# Patient Record
Sex: Female | Born: 1965
Health system: Southern US, Community
[De-identification: ages and names within clinical notes are randomized; demographics above are authoritative.]

## PROBLEM LIST (undated history)

## (undated) DIAGNOSIS — G9050 Complex regional pain syndrome I, unspecified: Secondary | ICD-10-CM

## (undated) DIAGNOSIS — K5010 Crohn's disease of large intestine without complications: Secondary | ICD-10-CM

## (undated) HISTORY — PX: ABDOMINAL HYSTERECTOMY: SHX81

## (undated) HISTORY — PX: COLON SURGERY: SHX602

---

## 2005-05-06 ENCOUNTER — Emergency Department (HOSPITAL_COMMUNITY): Admission: EM | Admit: 2005-05-06 | Discharge: 2005-05-06 | Payer: Self-pay | Admitting: Emergency Medicine

## 2006-02-08 ENCOUNTER — Emergency Department (HOSPITAL_COMMUNITY): Admission: EM | Admit: 2006-02-08 | Discharge: 2006-02-08 | Payer: Self-pay | Admitting: Emergency Medicine

## 2006-02-27 ENCOUNTER — Inpatient Hospital Stay (HOSPITAL_COMMUNITY): Admission: EM | Admit: 2006-02-27 | Discharge: 2006-03-06 | Payer: Self-pay | Admitting: Emergency Medicine

## 2007-04-26 ENCOUNTER — Emergency Department (HOSPITAL_COMMUNITY): Admission: EM | Admit: 2007-04-26 | Discharge: 2007-04-26 | Payer: Self-pay | Admitting: *Deleted

## 2007-05-06 ENCOUNTER — Ambulatory Visit: Payer: Self-pay | Admitting: Psychiatry

## 2007-05-06 ENCOUNTER — Other Ambulatory Visit: Payer: Self-pay | Admitting: Emergency Medicine

## 2007-05-06 ENCOUNTER — Inpatient Hospital Stay (HOSPITAL_COMMUNITY): Admission: RE | Admit: 2007-05-06 | Discharge: 2007-05-17 | Payer: Self-pay | Admitting: Psychiatry

## 2007-05-15 ENCOUNTER — Encounter (HOSPITAL_COMMUNITY): Payer: Self-pay | Admitting: Psychiatry

## 2007-06-07 ENCOUNTER — Ambulatory Visit: Payer: Self-pay | Admitting: Psychiatry

## 2007-06-07 ENCOUNTER — Inpatient Hospital Stay (HOSPITAL_COMMUNITY): Admission: RE | Admit: 2007-06-07 | Discharge: 2007-06-12 | Payer: Self-pay | Admitting: Psychiatry

## 2008-11-26 ENCOUNTER — Emergency Department (HOSPITAL_COMMUNITY): Admission: EM | Admit: 2008-11-26 | Discharge: 2008-11-26 | Payer: Self-pay | Admitting: Emergency Medicine

## 2008-12-06 ENCOUNTER — Inpatient Hospital Stay (HOSPITAL_COMMUNITY): Admission: EM | Admit: 2008-12-06 | Discharge: 2008-12-10 | Payer: Self-pay | Admitting: Emergency Medicine

## 2010-02-03 ENCOUNTER — Inpatient Hospital Stay: Payer: Self-pay | Admitting: Psychiatry

## 2010-03-08 ENCOUNTER — Emergency Department: Payer: Self-pay | Admitting: Emergency Medicine

## 2010-03-29 ENCOUNTER — Emergency Department: Payer: Self-pay | Admitting: Emergency Medicine

## 2010-06-04 ENCOUNTER — Encounter: Payer: Self-pay | Admitting: Gastroenterology

## 2010-08-21 LAB — URINE MICROSCOPIC-ADD ON

## 2010-08-21 LAB — DIFFERENTIAL
Basophils Absolute: 0 10*3/uL (ref 0.0–0.1)
Basophils Absolute: 0 10*3/uL (ref 0.0–0.1)
Basophils Absolute: 0.1 10*3/uL (ref 0.0–0.1)
Basophils Relative: 0 % (ref 0–1)
Basophils Relative: 1 % (ref 0–1)
Eosinophils Absolute: 0 10*3/uL (ref 0.0–0.7)
Eosinophils Relative: 0 % (ref 0–5)
Eosinophils Relative: 0 % (ref 0–5)
Lymphocytes Relative: 10 % — ABNORMAL LOW (ref 12–46)
Lymphocytes Relative: 28 % (ref 12–46)
Lymphocytes Relative: 40 % (ref 12–46)
Lymphs Abs: 4.2 10*3/uL — ABNORMAL HIGH (ref 0.7–4.0)
Monocytes Absolute: 0 10*3/uL — ABNORMAL LOW (ref 0.1–1.0)
Monocytes Absolute: 0.4 10*3/uL (ref 0.1–1.0)
Neutro Abs: 4.5 10*3/uL (ref 1.7–7.7)
Neutro Abs: 5.4 10*3/uL (ref 1.7–7.7)
Neutrophils Relative %: 52 % (ref 43–77)
Neutrophils Relative %: 65 % (ref 43–77)

## 2010-08-21 LAB — CBC
HCT: 37.5 % (ref 36.0–46.0)
HCT: 37.7 % (ref 36.0–46.0)
Hemoglobin: 13 g/dL (ref 12.0–15.0)
MCHC: 33.8 g/dL (ref 30.0–36.0)
MCHC: 34.4 g/dL (ref 30.0–36.0)
MCV: 92.1 fL (ref 78.0–100.0)
MCV: 92.4 fL (ref 78.0–100.0)
Platelets: 389 10*3/uL (ref 150–400)
Platelets: 399 10*3/uL (ref 150–400)
Platelets: 404 10*3/uL — ABNORMAL HIGH (ref 150–400)
RBC: 4.05 MIL/uL (ref 3.87–5.11)
RDW: 14.6 % (ref 11.5–15.5)
RDW: 15 % (ref 11.5–15.5)
WBC: 10.4 10*3/uL (ref 4.0–10.5)

## 2010-08-21 LAB — COMPREHENSIVE METABOLIC PANEL
Albumin: 4.4 g/dL (ref 3.5–5.2)
Alkaline Phosphatase: 85 U/L (ref 39–117)
BUN: 5 mg/dL — ABNORMAL LOW (ref 6–23)
Chloride: 106 mEq/L (ref 96–112)
Creatinine, Ser: 0.72 mg/dL (ref 0.4–1.2)
Glucose, Bld: 113 mg/dL — ABNORMAL HIGH (ref 70–99)
Total Bilirubin: 0.8 mg/dL (ref 0.3–1.2)

## 2010-08-21 LAB — URINALYSIS, ROUTINE W REFLEX MICROSCOPIC
Nitrite: NEGATIVE
Specific Gravity, Urine: 1.008 (ref 1.005–1.030)
Urobilinogen, UA: 1 mg/dL (ref 0.0–1.0)
pH: 6.5 (ref 5.0–8.0)

## 2010-08-21 LAB — BASIC METABOLIC PANEL
BUN: 8 mg/dL (ref 6–23)
CO2: 24 mEq/L (ref 19–32)
GFR calc non Af Amer: >60 mL/min (ref >60)
Glucose, Bld: 147 mg/dL — ABNORMAL HIGH (ref 70–99)
Potassium: 3.7 mEq/L (ref 3.5–5.1)

## 2010-08-21 LAB — CK: Total CK: 105 U/L (ref 7–177)

## 2010-09-27 NOTE — Discharge Summary (Signed)
Julia Vega, Julia Vega              ACCOUNT NO.:  [id number]   MEDICAL RECORD NO.:  [id number]          PATIENT TYPE:  IPS   LOCATION:  0503                          FACILITY:  [facility]   PHYSICIAN:  Anselm Jungling, MD  DATE OF BIRTH:  Sep 15, 1965   DATE OF ADMISSION:  06/07/2007  DATE OF DISCHARGE:  06/12/2007                               DISCHARGE SUMMARY   IDENTIFYING DATA AND REASON FOR ADMISSION:  This was an inpatient  psychiatric admission for Julia Vega, a 45 year old separated white female  who presented expressing suicidal and homicidal thoughts, and auditory  and visual hallucinations.  Please refer to the admission note for  further details pertaining to the symptoms, circumstances and history  that led to her hospitalization.  She was given an initial Axis I  diagnosis of bipolar affective disorder, currently depressed with  psychotic features, and history of PTSD.   MEDICAL AND LABORATORY:  The patient was medically and physically  assessed by the psychiatric nurse practitioner.  There were no  significant medical issues during this brief inpatient psychiatric stay.   HOSPITAL COURSE:  The patient was admitted to the adult inpatient  psychiatric service.  She presented as a well-nourished, well-developed  woman with depressed mood and a labile affect.  Thought process is clear  and rational.  Judgment and insight were felt to be fair.  She endorsed  feeling suicidal with a plan to slit her wrist.  She also endorsed  auditory and visual hallucinations of a command nature.   The patient was treated with a psychotropic regimen that included  Risperdal, Neurontin, Cymbalta, Elavil, trazodone, and Klonopin.  She  was continued on Reglan for GERD symptoms.  She was involved in the  therapeutic milieu including therapeutic groups and activities.   She was initially evaluated by Dr. Milford Cage.  The undersigned  assumed care on the fourth hospital day.  On that date, the  patient  indicated that she was feeling better, although still having some  auditory hallucinations.  She stated that they were less constant than  they had been before.  She made no delusional statements.  She described  broken sleep, with frequent wakening.  Her appetite was adequate.  Subsequently, Risperdal was increased further.  On the following day the  patient indicated that I feel better, no suicidal thoughts, voices have  subsided, I feel happier.  She attributed this to positive interactions  with peers, staff, and our therapy groups.  She denied any thoughts of  hurting others or herself.  She reported that she was sleeping well.  She still appeared somewhat flat and depressed, but was making no  delusional statements, and appeared appropriate for discharge.   AFTERCARE:  The patient agreed to return to [facility] mental  health for psychiatric treatment on June 12, 2006.  She was also to  follow-up with Dr. Lolly Mustache in our [place] clinic with an appointment  on July 23, 2007.   DISCHARGE MEDICATIONS:  Risperdal 4 mg q.h.s., Neurontin 400 mg t.i.d.,  Cymbalta 60 mg daily, Elavil 50 mg q.h.s., trazodone 50-100 mg h.s.  p.r.n. insomnia, Reglan 5 mg, as directed, Klonopin 1 mg t.i.d. p.r.n.  anxiety.  The patient was also instructed to contact [facility] for individual therapy.  The patient was instructed to stop  taking Seroquel, Rozerem, and Effexor.   DISCHARGE DIAGNOSES:  AXIS I: Bipolar affective disorder, depressed,  with psychotic features, resolving.  AXIS II: Deferred.  AXIS III: History of GERD.  AXIS IV: Stressors severe.  AXIS V: GAF on discharge 55.      Anselm Jungling, MD  Electronically Signed     SPB/MEDQ  D:  06/19/2007  T:  06/19/2007  Job:  [id number]
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
## 2010-09-27 NOTE — Discharge Summary (Signed)
Julia Vega, Julia Vega              ACCOUNT NO.:  [id number]   MEDICAL RECORD NO.:  [id number]          PATIENT TYPE:  INP   LOCATION:  3035                         FACILITY:  [facility]   PHYSICIAN:  Ruthy Dick, MD    DATE OF BIRTH:  1966/04/17   DATE OF ADMISSION:  12/06/2008  DATE OF DISCHARGE:  12/10/2008                               DISCHARGE SUMMARY   REASON FOR ADMISSION:  Intractable low back pain and leg weakness.   FINAL DISCHARGE DIAGNOSES:  1. Intractable low back pain.  2. Reflex sympathetic dystrophy.  3. Narcotic dependence.  4. Fibromyalgia.  5. Depression.  6. Status post epidural steroid injection.  7. L4 nerve root compression.   BRIEF HISTORY OF PRESENT ILLNESS:  Julia Vega is a 45 year old  Caucasian lady with a past medical history significant for chronic pain  syndrome, reflex sympathetic dystrophy, and fibromyalgia who came in  with complaints of back pain which had been going on for 1 month.  She  said over the last 2 days prior to admission it had been gotten  uncontrollable and she decided to come to the emergency room.  Workup  included CT scan of the C-spine which showed herniated disk fragments in  the left lateral recess at L4-L5, extending superiorly behind the body  of L4 in the left lateral recess, compression to L4 nerve roots and  herniated disk fragment at L4-L5.  The patient had steroid injection at  L4-L5 foramens and the patient was seen by the neurosurgeon.  The plan  of care for this patient was that she was not a candidate for surgery at  this time and her pain will be managed with outpatient physical therapy  and mostly nonsteroidal anti-inflammatory medications.  At the time of  discharge, she says she had some pain, though not as bad as it was when  she came to the hospital, but the neurosurgeon was very clear that there  was no more procedure anticipated at this time.  They believe that the  patient's pain would gradually get  better within a few days.  Today, the  patient has no other complaints, no chest pain, no abdominal pain, no  nausea, no vomiting, no diarrhea, no constipation, no palpitations, no  dysuria, no frequency, no syncope.   PHYSICAL EXAMINATION:  VITAL SIGNS:  Today, temperature 98.0, pulse 72,  respirations 18, blood pressure 130/82, and saturating 95% on room air.  CHEST:  Clear to auscultation bilaterally.  ABDOMEN:  Soft and nontender.  EXTREMITIES:  No clubbing, no cyanosis, no edema.  CARDIOVASCULAR:  First and second heart sounds only.  CENTRAL NERVOUS SYSTEMS:  Nonfocal.   CONSULT DURING THIS ADMISSION:  Neurosurgical consult.   PROCEDURES DONE DURING THIS ADMISSION:  A CT scan of the L-spine which  showed a herniated disk fragment in the left lateral recess at L4-5  extending superiorly behind the body of the L4 in the left lateral  recess and compressing the left L4 nerve roots.   Time used for discharge planning is greater than 30 minutes.   The patient is to go home on  the following medications:  1. Fentanyl patch 25 mcg every 72 hours transdermally.  2. Percocet 7.5/325 every 6 hours as needed for pain.  3. Mobic 7.5 mg p.o. daily.  4. Robaxin 500 mg p.o. q.8 hours p.r.n. for pain.   The narcotics will have to be controlled by the Pain Clinic with the  patient visits.  We are not being to give any prescription for the  narcotics, but will write a prescription for Mobic and Robaxin.  I have  explained this to the patient and she says she has to deal with the Pain  Clinic that she should not receive prescription from any other  physician.  Home health with physical therapy recommended.      Ruthy Dick, MD  Electronically Signed     GU/MEDQ  D:  12/10/2008  T:  12/10/2008  Job:  [id number]   cc:   [facility]
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
## 2010-09-27 NOTE — H&P (Signed)
Julia Vega, Julia Vega              ACCOUNT NO.:  [id number]   MEDICAL RECORD NO.:  [id number]          PATIENT TYPE:  INP   LOCATION:  3035                         FACILITY:  [facility]   PHYSICIAN:  Della Goo, M.D. DATE OF BIRTH:  01-15-1966   DATE OF ADMISSION:  12/06/2008  DATE OF DISCHARGE:                              HISTORY & PHYSICAL   PRIMARY CARE PHYSICIAN:  [facility]   CHIEF COMPLAINT:  Back pain, weakness of both legs.   HISTORY OF PRESENT ILLNESS:  This is a 45 year old female who presents  to the emergency department with complaints of severe low back pain for  one month.  She reports over the past 2 days the pain has worsened and  has been uncontrollable.  She states the pain has started to radiate  into her left leg.  She also reports beginning to have pain in her right  leg as well.  The patient denies having any recent trauma.  The patient  does state that she removed her fentanyl patch one day ago and has not  replaced her patch.  She also states that she has not seen her primary  care physician in regard to her symptoms.  The patient had been [place]  [place] for her symptoms,  and she states they could not find any problems.  The patient also  reports having transient edema of both lower legs.   PAST MEDICAL HISTORY:  1. Significant for fibromyalgia.  2. History of reflex sympathetic dystrophy of the right arm after      surgery 7 years ago.   PAST SURGICAL HISTORY:  1. History of a hysterectomy.  2. Appendectomy.  3. Tonsillectomy.  4. Adenoidectomy.  5. History of a De Quervain's release surgery of the right arm.   MEDICATIONS:  1. Fentanyl patch 25 mcg per hour one patch q.72 h.  2. Percocet 7.5 mg/500 mg one p.o. q.6 h p.r.n. pain.  3. Xanax 0.5 mg p.o. b.i.d.   ALLERGIES:  NO KNOWN DRUG ALLERGIES.   SOCIAL HISTORY:  The patient is a smoker, smokes a half-a-pack of  cigarettes  daily.  She denies drinking and denies any illicit drug  usage.   FAMILY HISTORY:  Noncontributory.   REVIEW OF SYSTEMS:  Pertinents as mentioned above.   PHYSICAL EXAMINATION FINDINGS:  GENERAL:  This is a 45 year old anxious,  tearful, well-nourished, well-developed female in discomfort but no  acute distress.  VITAL SIGNS:  Are temperature 98.0, blood pressure 131/81, heart rate  89, respirations 18-20 and O2 saturation 98%.  HEENT:  Examination normocephalic, atraumatic.  Pupils equally round  reactive to light.  Extraocular movements are intact funduscopic benign.  There is no scleral icterus.  Nares are patent bilaterally.  Oropharynx  is clear.  NECK:  Supple full range of motion.  No thyromegaly, adenopathy, jugular  venous distention.  CARDIOVASCULAR:  Regular rate and rhythm.  No murmurs, gallops or rubs.  LUNGS:  Clear to auscultation bilaterally.  ABDOMEN:  Positive bowel sounds, soft, nontender, nondistended.  EXTREMITIES:  Without cyanosis, clubbing  or edema.  NEUROLOGIC:  The patient is alert and oriented x3.  Cranial nerves are  intact.  She is able to move all four of her extremities.  There are no  focal deficits on examination.   LABORATORY STUDIES:  White blood cell count 7.0, hemoglobin 12.7,  hematocrit 37.5, MCV 92.4, platelets 399, neutrophils 65%, lymphocytes  28%.  Sodium 139, potassium 3.7, chloride 106, carbon dioxide 22, BUN 5,  creatinine 0.72 and glucose 113.  Urinalysis with trace leukocyte  esterase and trace hemoglobin.  Urine microscopic with 0-2 white blood  cells.  CT scan of the lumbar spine without contrast performed reveals a  small herniated disk fragment in the left lateral recess behind the body  of L4 compressing the left L4 nerve root.   ASSESSMENT:  A 45 year old female being admitted with  1. Intractable low back pain with left lower extremity sciatica.  2. Herniated disk with left lateral recess compressing the left L4      nerve  root seen on CT.  3. Fibromyalgia.  4. RSD of the right upper extremity.  5. Chronic pain syndrome.   PLAN:  The patient will be admitted for 23 hour observation.  Pain  control therapy has been ordered to help get the patient's pain under  control.  The patient will also be placed on muscle relaxant therapy as  needed along with a steroid taper.  Referral to neurosurgery for  evaluation of the patient's low back finding on CT scan will be  considered whether to be done as an inpatient versus outpatient.  The  patient will be placed on DVT and GI prophylaxis as well.      Della Goo, M.D.  Electronically Signed     HJ/MEDQ  D:  12/07/2008  T:  12/07/2008  Job:  [id number]
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
## 2010-09-30 NOTE — Consult Note (Signed)
Julia Vega, Julia Vega              ACCOUNT NO.:  [id number]   MEDICAL RECORD NO.:  [id number]           PATIENT TYPE:   LOCATION:                                 FACILITY:   PHYSICIAN:  Revonda Standard L. Rennis Harding, N.P. DATE OF BIRTH:  October 19, 1965   DATE OF CONSULTATION:  02/28/2006  DATE OF DISCHARGE:                                   CONSULTATION   CONSULTING SURGEON:  Adolph Pollack, M.D.   REQUESTING PHYSICIAN:  Dr. Randa Evens.   PRIMARY CARE PHYSICIAN:  Dr. Pernell Dupre [place]   REASON FOR CONSULTATION:  Right upper quadrant abdominal pain x2 months and  probable ileus.   HISTORY OF PRESENT ILLNESS:  Julia Vega is a 45 year old female patient.  Surgical history consistent with a prior total abdominal hysterectomy and  open appendectomy.  She does have RSV involving the right wrist, but does  not take any chronic of pain medications for this.  The patient reports 2  months worth of intermittent right upper quadrant pain, usually  postprandial, and usually aggravated by fatty/greasy-type foods. Because of  this, she has developed significant anorexia and avoids most type of foods.  When she does try to eat solid foods, she has noticed that she will have  immediate emesis, but she denies dysphasia or pain with swallowing or a  sensation of food getting stuck into the chest.   She reports over the past 2 months she has mostly been having watery  diarrhea with occasional small pieces of formed stool noted, no blood in the  stool, and occasional low grade temperatures.  Over the past 2-3 days her  pain has become more severe and near constant; and she has developed  significant abdominal distension.  The distension seems to have been  worsening progressively over the past 2 weeks the pain was now radiating  from the right upper quadrant through to the back.  She has had several  workups done as an outpatient apparently in [place].  Within the past  month she has had  ultrasound and CT which were unrevealing.  She was  admitted on 02/27/2006 where a CT scan, ultrasound, and x-ray were read by  radiologist with no acute process.  Her LFTs have been normal.  Her white  count is normal; and an amylase was normal.  On the 16th she was in the ER  in September; and a lipase was normal then.  Because of the patient's  confounding history and  significant pain and abdominal distension a  surgical consultation has been requested.   REVIEW OF SYSTEMS:  As above.  Again, the patient denies dysphagia.  She is  reporting immediate emesis after eating.  She develops pain, the nausea,  then the emesis.  She reports that she has not had an EGD since the onset of  the symptoms.  She had a colonoscopy about 1 month ago which was  unrevealing.  She reports occasional dark urine that is nonodorous.   PAST MEDICAL HISTORY:  1. RSV of the right wrist after ganglion cyst was removed.  2. Dyslipidemia.  PAST SURGICAL HISTORY:  1. Total abdominal hysterectomy due to prior ovarian cyst.  2. Appendectomy, open.  3. Diagnostic laparoscopy.   SURGICAL HISTORY:  She does smoke.  She denies alcoholic beverages.  She is  disabled secondary to her RSV.   FAMILY HISTORY:  Significant for gastric cancer and ovarian cancer.   ALLERGIES:  MORPHINE and VICODIN cause her eyes to swell.   HOME MEDICATIONS INCLUDE:  1. Phenergan  2. Since admission the patient has been started on IV Dilaudid, Zofran and      IV fluids.   PHYSICAL EXAMINATION:  GENERAL: This is a an acutely ill-appearing female  patient complaining of severe right upper quadrant abdominal pain with  marked distension of the abdomen.  VITAL SIGNS:  Temperature 98.4, BP 88/54, pulse 64, respirations 20.  NEURO:  The patient is awake and oriented x3 moving all extremities x4  without focal neurological deficits.  HEENT:  Head is normocephalic.  Sclerae noninjected, nonicteric.  NECK:  Supple.  No adenopathy.   CHEST:  Bilateral lung sounds are clear to auscultation.  Respiratory effort  is nonlabored.  CARDIAC:  S1-S2 regular pulse.  No murmurs, thrills, or gallops.  Systolic  blood pressure down to 88.  IV fluids are at 100 an hour.  ABDOMEN:  Markedly distended and tympanitic.  Diffusely tender with  diminished bowel sounds.  She is also more tender with guarding and  rebounding in the right upper quadrant.  There are no hernias noted in the  prior low Pfannenstiel incision sites.  EXTREMITIES:  Symmetrical in appearance without edema, cyanosis, or  clubbing.   LAB:  Amylase 74, lipase normal in September of this year.  Urinalysis  negative.  White count 4800, hemoglobin 11.7, platelets 322,000.  Sodium  139, potassium 3.7, glucose 94, BUN 7, creatinine 0.9, ALT of 17, AST 19,  total bilirubin 0.6.  Diagnostic CT of the abdomen and pelvis as noted in  the history present illness.  I have reviewed the CT scan; and upon my  review, she appears to have some mild gallbladder wall thickening, no  stones, no pericholecystic fluid.  She does have a significant amount of  colon stool; and there is contrast throughout the small bowel but does not  pass completely through the colon.  Actually at the distal small bowel,  there appears be some dilatation, and the colorectal area is decompressed.  On the plain abdominal films there is a nonspecific bowel gas pattern.  I  did not review the ultrasound   IMPRESSION:  1. Right upper quadrant abdominal pain, possible chronic cholecystitis,      etiology uncertain based on diagnostic workup.  2. Abdominal distension secondary to probable fecal impaction as well as      ileus.  3. Positive family medical history of gastric cancer.  4. RSV of the right hand and wrist.   PLAN:  1. As noted the diagnostic evaluation is not revealing.  Given the     patient's clinical presentation, we will check a HIDA scan to rule out      biliary dyskinesia or  cholecystitis that is not showing up on CT or      ultrasound.  2. If the HIDA scan is negative the patient may need a small-bowel follow-      through to rule out gastric outlet obstruction motility disorder or      proximal small bowel issues.  3. No sure if chronic constipation is a contributing factor.  The patient      denies history of constipation issues or change in bowel patterns until      2 months ago when these initial symptoms started.  4. The patient has mild decrease in systolic blood pressure.  After pain      medications have been received, we will      increase her IV fluids to 150 an hour given that she has abdominal      distension and an ileus pattern.  5. Uncertain at this point if noninvasive diagnostic workup remains      unrevealing if diagnostic laparoscopy would be helpful.  I will defer      this to Dr. Abbey Chatters after he examines the patient and reviews the x-      ray studies.      Allison L. Rennis Harding, N.P.     ALE/MEDQ  D:  02/28/2006  T:  03/01/2006  Job:  [id number]   cc:   [place]  Deal Dr. Pernell Dupre

## 2010-09-30 NOTE — Discharge Summary (Signed)
Julia Vega, BUFFONE              ACCOUNT NO.:  [id number]   MEDICAL RECORD NO.:  [id number]          PATIENT TYPE:  OUT   LOCATION:  NUC                          FACILITY:  [facility]   PHYSICIAN:  James L. Randa Evens, M.D. DATE OF BIRTH:  [birth year]   DATE OF ADMISSION:  02/27/2006  DATE OF DISCHARGE:  03/06/2006                                 DISCHARGE SUMMARY   DIAGNOSIS:  Abdominal pain, distension, shortness of breath.   DIAGNOSES:  Is still abdominal pain and distension of a unclear etiology.   SERVICE:  Gastroenterology.   HISTORY:  This is a 45 year old female with a history of psychiatric  disorders including depression and PTSD.  She reports a 2-1/1-month history  of abdominal pain, distension, vomiting after she eats.  She describes the  pain as right upper quadrant that radiates to her back, wakes her up in the  middle of the night, is worse when eating fatty foods.  She had a recent  hospital stay at [facility] in September 2007 for similar symptoms.  She had a normal colonoscopy and showed gastritis on EGD at [facility].  During this hospital stay at [facility], she has any  diagnostic exams including ultrasound of her abdomen, a CT of her abdomen  and pelvis, HIDA scan.  We were unable to do ejection fraction on the HIDA  scan because they had given her morphine as well as a small bowel follow  through.  All of these diagnostics proved to be within normal limits and did  not give [place] an etiology of her abdominal pain and distention.  She had a  normal TSH.  She had normal comprehensive metabolic panel including normal  liver enzymes.  She had normal urine dip while she was here.  She did have a  consult with Dr. Abbey Chatters of [facility].  They evaluated her  for possible cholecystectomy, but he said he felt  that she had no surgical  issues  Did have a discussion with the patient that sometimes gallbladder  pain can be deceiving in  that it does not show up in laboratory diagnostics,  yet some people feel better after their gallbladder has been removed.  Our  plan for her was to discontinue the Dilaudid and repeat the HIDA scan, to  make an extra effort to her to gain the ejection fraction with CCK.  We  discontinued the Dilaudid last night.  Her pain increased.  When we went to  her room this morning to evaluate her, she told us that she had decided to  leave.  We explained that we would really like for her to stay so that we  could continue with the HIDA scan and the CCK today today.  If that was  negative, we were going to check gastric emptying study for possible  gastroparesis, but she had made up her mind to leave.  We did go ahead and  write discharge orders and gave her a prescription for Reglan.  She was  advised to follow up with Dr. Carman Ching at [facility] as  well as her primary care physician Dr. Pernell Dupre in [place].  It is worth  noting that before the patient could receive formal discharge from the nurse  and her discharge instruction, she pulled out her own IV and left.  The  patient was discharged in stable condition.   DISCHARGE MEDICATIONS:  1. Phenergan which she was on when she came in.  2. MiraLax.  3. Reglan 10 mg 30 minutes before meals and at bedtime.  She received a      prescription for 120 pills of Reglan.  A      psychiatric consult was also offered to the patient while she was here      and she refused it.  That that at that the patient please send a copy      of note to Dr. Fayrene Fearing that were well as Dr. Doreatha Martin again as well as Dr. ad      on the primary care physician point and that was drawn on x-ray she      Stephani Police, [place]    ______________________________  Llana Aliment. Randa Evens, M.D.    MLY/MEDQ  D:  03/06/2006  T:  03/07/2006  Job:  [id number]   cc:   Fayrene Fearing L. Malon Kindle., M.D.  Graylin Shiver, M.D.  [place], [place] Bonney Leitz, M.D.

## 2010-09-30 NOTE — H&P (Signed)
Julia Vega, Julia Vega              ACCOUNT NO.:  [id number]   MEDICAL RECORD NO.:  [id number]          PATIENT TYPE:  INP   LOCATION:  6737                         FACILITY:  [facility]   PHYSICIAN:  James L. Malon Kindle., M.D.DATE OF BIRTH:  May 14, 1966   DATE OF ADMISSION:  02/27/2006  DATE OF DISCHARGE:                                HISTORY & PHYSICAL   REASON FOR ADMISSION:  Progressive abdominal pain with abdominal distention  and shortness of breath.   HISTORY:  A 45 year old woman that I saw for the first time several days ago  for abdominal pain that she has had intermittently for 3 months.  This is  epigastric, right upper quadrant, goes around the back, is worse after  eating.  She was evaluated in [place] and the records that we obtained  from [facility] showed that she has had a CT scan of the abdomen, which was  normal.  She is reported to have an ultrasound of the abdomen and it is  normal, but we do not have a copy of that.  We saw her in the office for the  first time and have still not obtained the ultrasound of the abdomen.  She  did have a CT of the abdomen as well, with a renal stone protocol, and that  was negative.  She said, initially, the pain would come and go, but it has  gotten progressively constant.  We saw her for the first time 4 days ago,  she was quite tender in the right upper quadrant, stools were negative.  We  ordered a hepatobiliary scan, which has not yet been done.  Labs were  obtained.  Her sed rate, liver function, and CBC were all normal.  The  patient notes that, since yesterday, the pain has gotten progressively  worse.  During the night, she got short of breath, it hurts when she  breaths.  Her abdomen has become much more distended.  She had been having  diarrhea, now she is not really having much in the way of stools.  She still  has no fever.   CURRENT MEDICATIONS:  Phenergan.   ALLERGIES:  NO DRUG ALLERGIES.   PAST MEDICAL HISTORY:   History of migraines and depression.  No other  chronic medical problems.  She is status post tonsillectomy, appendectomy,  hysterectomy, and surgery on her arm.  She has RSD after her surgery.   FAMILY HISTORY:  Positive for colon polyps in mother, father, and brother.  Gallstones in her grandmother.   SOCIAL HISTORY:  She is disabled, has RSD, lives with her mother, smokes 1/2  pack per day and has for 5 years, does not drink.   REVIEW OF SYSTEMS:  Remarkable for the pain, recent diarrhea.   PHYSICAL EXAMINATION:  VITAL SIGNS:  Temperature 97, pulse 62, blood  pressure 110/70, weight 162.  GENERAL:  A somewhat chubby, white female who clearly appears to be in pain.  She is alert and oriented.  EYES:  Sclerae nonicteric.  NECK:  Supple  LUNGS:  Clear.  I do not hear any friction rubs on  the right.  HEART:  Without murmurs or gallops.  ABDOMEN:  Markedly distended and quiet.  I hear no bowel sounds.  There is  marked tenderness and some rigidity on the right side with guarding.  RECTAL:  Not repeated.  Stool was heme-negative just a couple of days ago.   ASSESSMENT:  Severe right-sided pain.  The patient, now, has shortness of  breath and pain going down her right leg with this.  There is concern that  she could have had a perforation, particularly in view of her quite and  distended abdomen.   PLAN:  Will admit her to the hospital and obtain an acute abdominal series  and make further recommendations depending on thoughts of this.  She may  need another ultrasound and hepatobiliary scan.           ______________________________  Llana Aliment. Malon Kindle., M.D.     Waldron Session  D:  02/27/2006  T:  02/28/2006  Job:  [id number]   cc:   Raina Mina, M.D.

## 2010-11-04 ENCOUNTER — Emergency Department (HOSPITAL_BASED_OUTPATIENT_CLINIC_OR_DEPARTMENT_OTHER)
Admission: EM | Admit: 2010-11-04 | Discharge: 2010-11-04 | Disposition: A | Discharge status: Home / Self Care | Payer: Medicare Other | Attending: Emergency Medicine | Admitting: Emergency Medicine

## 2010-11-04 DIAGNOSIS — G8929 Other chronic pain: Secondary | ICD-10-CM | POA: Insufficient documentation

## 2010-11-04 DIAGNOSIS — IMO0002 Reserved for concepts with insufficient information to code with codable children: Secondary | ICD-10-CM | POA: Insufficient documentation

## 2010-11-04 DIAGNOSIS — F17200 Nicotine dependence, unspecified, uncomplicated: Secondary | ICD-10-CM | POA: Insufficient documentation

## 2010-11-04 DIAGNOSIS — M79609 Pain in unspecified limb: Secondary | ICD-10-CM | POA: Insufficient documentation

## 2010-11-04 DIAGNOSIS — IMO0001 Reserved for inherently not codable concepts without codable children: Secondary | ICD-10-CM | POA: Insufficient documentation

## 2010-11-05 ENCOUNTER — Other Ambulatory Visit (HOSPITAL_BASED_OUTPATIENT_CLINIC_OR_DEPARTMENT_OTHER): Payer: Self-pay

## 2011-02-02 LAB — DRUGS OF ABUSE SCREEN W/O ALC, ROUTINE URINE
Amphetamine Screen, Ur: NEGATIVE
Benzodiazepines.: NEGATIVE
Cocaine Metabolites: NEGATIVE
Phencyclidine (PCP): NEGATIVE
Propoxyphene: NEGATIVE

## 2011-02-02 LAB — HEPATITIS PANEL, ACUTE
Hep A IgM: NEGATIVE
Hep B C IgM: NEGATIVE
Hepatitis B Surface Ag: NEGATIVE

## 2011-02-02 LAB — PREGNANCY, URINE: Preg Test, Ur: NEGATIVE

## 2011-02-02 LAB — THC (MARIJUANA), URINE, CONFIRMATION: Marijuana, Ur-Confirmation: 19 ng/mL

## 2011-02-17 LAB — CBC
Hemoglobin: 13.1
Hemoglobin: 11.2 — ABNORMAL LOW
MCHC: 33.9
MCV: 90.7
MCV: 88.7
RBC: 4.27
RBC: 3.64 — ABNORMAL LOW
RDW: 14.9
WBC: 6.5

## 2011-02-17 LAB — COMPREHENSIVE METABOLIC PANEL
ALT: 127 — ABNORMAL HIGH
AST: 54 — ABNORMAL HIGH
CO2: 30
Chloride: 103
Creatinine, Ser: 0.79
GFR calc Af Amer: >60
GFR calc non Af Amer: >60
Total Bilirubin: 0.4

## 2011-02-17 LAB — URINALYSIS, ROUTINE W REFLEX MICROSCOPIC
Bilirubin Urine: NEGATIVE
Ketones, ur: NEGATIVE
Nitrite: NEGATIVE
Protein, ur: NEGATIVE
Urobilinogen, UA: 0.2
pH: 7

## 2011-02-17 LAB — I-STAT 8, (EC8 V) (CONVERTED LAB)
Bicarbonate: 24.6 — ABNORMAL HIGH
Glucose, Bld: 119 — ABNORMAL HIGH
HCT: 44
TCO2: 26
pCO2, Ven: 38.4 — ABNORMAL LOW
pH, Ven: 7.414 — ABNORMAL HIGH

## 2011-02-17 LAB — URINE CULTURE: Colony Count: NO GROWTH

## 2011-02-17 LAB — RAPID URINE DRUG SCREEN, HOSP PERFORMED
Amphetamines: NOT DETECTED
Benzodiazepines: POSITIVE — AB
Cocaine: NOT DETECTED
Opiates: NOT DETECTED
Tetrahydrocannabinol: POSITIVE — AB

## 2011-02-17 LAB — DIFFERENTIAL
Basophils Absolute: 0.1
Basophils Relative: 1
Eosinophils Absolute: 0.1 — ABNORMAL LOW
Monocytes Absolute: 0.3
Monocytes Relative: 5

## 2011-08-29 DIAGNOSIS — Z933 Colostomy status: Secondary | ICD-10-CM | POA: Diagnosis not present

## 2011-08-29 DIAGNOSIS — Z79899 Other long term (current) drug therapy: Secondary | ICD-10-CM | POA: Diagnosis not present

## 2011-08-29 DIAGNOSIS — K5732 Diverticulitis of large intestine without perforation or abscess without bleeding: Secondary | ICD-10-CM | POA: Diagnosis not present

## 2011-08-29 DIAGNOSIS — R109 Unspecified abdominal pain: Secondary | ICD-10-CM | POA: Diagnosis not present

## 2011-08-29 DIAGNOSIS — R1031 Right lower quadrant pain: Secondary | ICD-10-CM | POA: Diagnosis not present

## 2011-08-29 DIAGNOSIS — R112 Nausea with vomiting, unspecified: Secondary | ICD-10-CM | POA: Diagnosis not present

## 2011-09-28 DIAGNOSIS — Z933 Colostomy status: Secondary | ICD-10-CM | POA: Diagnosis not present

## 2011-09-28 DIAGNOSIS — K5732 Diverticulitis of large intestine without perforation or abscess without bleeding: Secondary | ICD-10-CM | POA: Diagnosis not present

## 2011-09-28 DIAGNOSIS — R109 Unspecified abdominal pain: Secondary | ICD-10-CM | POA: Diagnosis not present

## 2011-09-28 DIAGNOSIS — K5730 Diverticulosis of large intestine without perforation or abscess without bleeding: Secondary | ICD-10-CM | POA: Diagnosis not present

## 2011-09-30 DIAGNOSIS — R112 Nausea with vomiting, unspecified: Secondary | ICD-10-CM | POA: Diagnosis not present

## 2011-09-30 DIAGNOSIS — R109 Unspecified abdominal pain: Secondary | ICD-10-CM | POA: Diagnosis not present

## 2011-09-30 DIAGNOSIS — R1011 Right upper quadrant pain: Secondary | ICD-10-CM | POA: Diagnosis not present

## 2011-09-30 DIAGNOSIS — M549 Dorsalgia, unspecified: Secondary | ICD-10-CM | POA: Diagnosis not present

## 2011-09-30 DIAGNOSIS — R1013 Epigastric pain: Secondary | ICD-10-CM | POA: Diagnosis not present

## 2011-09-30 DIAGNOSIS — K824 Cholesterolosis of gallbladder: Secondary | ICD-10-CM | POA: Diagnosis not present

## 2011-09-30 DIAGNOSIS — K7689 Other specified diseases of liver: Secondary | ICD-10-CM | POA: Diagnosis not present

## 2011-09-30 DIAGNOSIS — Z79899 Other long term (current) drug therapy: Secondary | ICD-10-CM | POA: Diagnosis not present

## 2011-09-30 DIAGNOSIS — K828 Other specified diseases of gallbladder: Secondary | ICD-10-CM | POA: Diagnosis not present

## 2011-10-10 DIAGNOSIS — K562 Volvulus: Secondary | ICD-10-CM | POA: Diagnosis not present

## 2011-10-10 DIAGNOSIS — K811 Chronic cholecystitis: Secondary | ICD-10-CM | POA: Diagnosis not present

## 2011-10-10 DIAGNOSIS — R109 Unspecified abdominal pain: Secondary | ICD-10-CM | POA: Diagnosis not present

## 2011-10-10 DIAGNOSIS — Z433 Encounter for attention to colostomy: Secondary | ICD-10-CM | POA: Diagnosis not present

## 2011-10-10 DIAGNOSIS — K810 Acute cholecystitis: Secondary | ICD-10-CM | POA: Diagnosis not present

## 2011-10-10 DIAGNOSIS — K824 Cholesterolosis of gallbladder: Secondary | ICD-10-CM | POA: Diagnosis not present

## 2011-10-10 DIAGNOSIS — Z933 Colostomy status: Secondary | ICD-10-CM | POA: Diagnosis not present

## 2011-10-10 DIAGNOSIS — K8010 Calculus of gallbladder with chronic cholecystitis without obstruction: Secondary | ICD-10-CM | POA: Diagnosis not present

## 2011-10-10 DIAGNOSIS — K6389 Other specified diseases of intestine: Secondary | ICD-10-CM | POA: Diagnosis not present

## 2011-10-10 DIAGNOSIS — N736 Female pelvic peritoneal adhesions (postinfective): Secondary | ICD-10-CM | POA: Diagnosis not present

## 2011-10-10 DIAGNOSIS — K5900 Constipation, unspecified: Secondary | ICD-10-CM | POA: Diagnosis not present

## 2011-10-10 DIAGNOSIS — K5732 Diverticulitis of large intestine without perforation or abscess without bleeding: Secondary | ICD-10-CM | POA: Diagnosis not present

## 2011-10-10 DIAGNOSIS — Z79899 Other long term (current) drug therapy: Secondary | ICD-10-CM | POA: Diagnosis not present

## 2011-10-10 DIAGNOSIS — K8020 Calculus of gallbladder without cholecystitis without obstruction: Secondary | ICD-10-CM | POA: Diagnosis not present

## 2011-10-10 DIAGNOSIS — K5730 Diverticulosis of large intestine without perforation or abscess without bleeding: Secondary | ICD-10-CM | POA: Diagnosis not present

## 2011-10-10 DIAGNOSIS — R112 Nausea with vomiting, unspecified: Secondary | ICD-10-CM | POA: Diagnosis not present

## 2011-10-21 DIAGNOSIS — Z452 Encounter for adjustment and management of vascular access device: Secondary | ICD-10-CM | POA: Diagnosis not present

## 2011-10-21 DIAGNOSIS — Z8719 Personal history of other diseases of the digestive system: Secondary | ICD-10-CM | POA: Diagnosis not present

## 2011-10-22 DIAGNOSIS — Z452 Encounter for adjustment and management of vascular access device: Secondary | ICD-10-CM | POA: Diagnosis not present

## 2011-10-22 DIAGNOSIS — Z8719 Personal history of other diseases of the digestive system: Secondary | ICD-10-CM | POA: Diagnosis not present

## 2011-10-23 DIAGNOSIS — Z452 Encounter for adjustment and management of vascular access device: Secondary | ICD-10-CM | POA: Diagnosis not present

## 2011-10-23 DIAGNOSIS — Z8719 Personal history of other diseases of the digestive system: Secondary | ICD-10-CM | POA: Diagnosis not present

## 2011-10-24 DIAGNOSIS — Z452 Encounter for adjustment and management of vascular access device: Secondary | ICD-10-CM | POA: Diagnosis not present

## 2011-10-24 DIAGNOSIS — Z8719 Personal history of other diseases of the digestive system: Secondary | ICD-10-CM | POA: Diagnosis not present

## 2011-10-25 DIAGNOSIS — Z452 Encounter for adjustment and management of vascular access device: Secondary | ICD-10-CM | POA: Diagnosis not present

## 2011-10-25 DIAGNOSIS — Z8719 Personal history of other diseases of the digestive system: Secondary | ICD-10-CM | POA: Diagnosis not present

## 2011-10-26 DIAGNOSIS — Z452 Encounter for adjustment and management of vascular access device: Secondary | ICD-10-CM | POA: Diagnosis not present

## 2011-10-26 DIAGNOSIS — Z8719 Personal history of other diseases of the digestive system: Secondary | ICD-10-CM | POA: Diagnosis not present

## 2011-10-27 DIAGNOSIS — Z452 Encounter for adjustment and management of vascular access device: Secondary | ICD-10-CM | POA: Diagnosis not present

## 2011-10-27 DIAGNOSIS — Z8719 Personal history of other diseases of the digestive system: Secondary | ICD-10-CM | POA: Diagnosis not present

## 2011-10-28 DIAGNOSIS — Z452 Encounter for adjustment and management of vascular access device: Secondary | ICD-10-CM | POA: Diagnosis not present

## 2011-10-28 DIAGNOSIS — Z8719 Personal history of other diseases of the digestive system: Secondary | ICD-10-CM | POA: Diagnosis not present

## 2012-01-22 DIAGNOSIS — Z8719 Personal history of other diseases of the digestive system: Secondary | ICD-10-CM | POA: Diagnosis not present

## 2012-01-22 DIAGNOSIS — M545 Low back pain: Secondary | ICD-10-CM | POA: Diagnosis not present

## 2012-01-22 DIAGNOSIS — F17200 Nicotine dependence, unspecified, uncomplicated: Secondary | ICD-10-CM | POA: Diagnosis not present

## 2012-01-22 DIAGNOSIS — Z886 Allergy status to analgesic agent status: Secondary | ICD-10-CM | POA: Diagnosis not present

## 2012-01-22 DIAGNOSIS — R109 Unspecified abdominal pain: Secondary | ICD-10-CM | POA: Diagnosis not present

## 2012-01-22 DIAGNOSIS — Z79899 Other long term (current) drug therapy: Secondary | ICD-10-CM | POA: Diagnosis not present

## 2012-01-22 DIAGNOSIS — M549 Dorsalgia, unspecified: Secondary | ICD-10-CM | POA: Diagnosis not present

## 2012-01-22 DIAGNOSIS — Z888 Allergy status to other drugs, medicaments and biological substances status: Secondary | ICD-10-CM | POA: Diagnosis not present

## 2012-01-26 DIAGNOSIS — Z886 Allergy status to analgesic agent status: Secondary | ICD-10-CM | POA: Diagnosis not present

## 2012-01-26 DIAGNOSIS — F17200 Nicotine dependence, unspecified, uncomplicated: Secondary | ICD-10-CM | POA: Diagnosis not present

## 2012-01-26 DIAGNOSIS — IMO0001 Reserved for inherently not codable concepts without codable children: Secondary | ICD-10-CM | POA: Diagnosis not present

## 2012-01-26 DIAGNOSIS — G8929 Other chronic pain: Secondary | ICD-10-CM | POA: Diagnosis not present

## 2012-01-26 DIAGNOSIS — M549 Dorsalgia, unspecified: Secondary | ICD-10-CM | POA: Diagnosis not present

## 2012-01-26 DIAGNOSIS — M545 Low back pain, unspecified: Secondary | ICD-10-CM | POA: Diagnosis not present

## 2012-01-26 DIAGNOSIS — Z79899 Other long term (current) drug therapy: Secondary | ICD-10-CM | POA: Diagnosis not present

## 2012-01-26 DIAGNOSIS — T148XXA Other injury of unspecified body region, initial encounter: Secondary | ICD-10-CM | POA: Diagnosis not present

## 2012-01-26 DIAGNOSIS — Z888 Allergy status to other drugs, medicaments and biological substances status: Secondary | ICD-10-CM | POA: Diagnosis not present

## 2012-01-26 DIAGNOSIS — Z729 Problem related to lifestyle, unspecified: Secondary | ICD-10-CM | POA: Diagnosis not present

## 2012-01-26 DIAGNOSIS — M47817 Spondylosis without myelopathy or radiculopathy, lumbosacral region: Secondary | ICD-10-CM | POA: Diagnosis not present

## 2012-02-01 DIAGNOSIS — R143 Flatulence: Secondary | ICD-10-CM | POA: Diagnosis not present

## 2012-02-01 DIAGNOSIS — R109 Unspecified abdominal pain: Secondary | ICD-10-CM | POA: Diagnosis not present

## 2012-02-01 DIAGNOSIS — R141 Gas pain: Secondary | ICD-10-CM | POA: Diagnosis not present

## 2012-02-01 DIAGNOSIS — Z79899 Other long term (current) drug therapy: Secondary | ICD-10-CM | POA: Diagnosis not present

## 2012-02-05 DIAGNOSIS — R112 Nausea with vomiting, unspecified: Secondary | ICD-10-CM | POA: Diagnosis not present

## 2012-02-05 DIAGNOSIS — K5732 Diverticulitis of large intestine without perforation or abscess without bleeding: Secondary | ICD-10-CM | POA: Diagnosis not present

## 2012-02-05 DIAGNOSIS — R109 Unspecified abdominal pain: Secondary | ICD-10-CM | POA: Diagnosis not present

## 2012-02-05 DIAGNOSIS — K5730 Diverticulosis of large intestine without perforation or abscess without bleeding: Secondary | ICD-10-CM | POA: Diagnosis not present

## 2012-02-15 DIAGNOSIS — E871 Hypo-osmolality and hyponatremia: Secondary | ICD-10-CM | POA: Diagnosis not present

## 2012-02-15 DIAGNOSIS — E876 Hypokalemia: Secondary | ICD-10-CM | POA: Diagnosis not present

## 2012-02-15 DIAGNOSIS — G90519 Complex regional pain syndrome I of unspecified upper limb: Secondary | ICD-10-CM | POA: Diagnosis present

## 2012-02-15 DIAGNOSIS — M545 Low back pain: Secondary | ICD-10-CM | POA: Diagnosis present

## 2012-02-15 DIAGNOSIS — K648 Other hemorrhoids: Secondary | ICD-10-CM | POA: Diagnosis present

## 2012-02-15 DIAGNOSIS — N390 Urinary tract infection, site not specified: Secondary | ICD-10-CM | POA: Diagnosis not present

## 2012-02-15 DIAGNOSIS — R110 Nausea: Secondary | ICD-10-CM | POA: Diagnosis not present

## 2012-02-15 DIAGNOSIS — K5289 Other specified noninfective gastroenteritis and colitis: Secondary | ICD-10-CM | POA: Diagnosis not present

## 2012-02-15 DIAGNOSIS — K5900 Constipation, unspecified: Secondary | ICD-10-CM | POA: Diagnosis not present

## 2012-02-15 DIAGNOSIS — F329 Major depressive disorder, single episode, unspecified: Secondary | ICD-10-CM | POA: Diagnosis present

## 2012-02-15 DIAGNOSIS — R109 Unspecified abdominal pain: Secondary | ICD-10-CM | POA: Diagnosis not present

## 2012-02-15 DIAGNOSIS — R933 Abnormal findings on diagnostic imaging of other parts of digestive tract: Secondary | ICD-10-CM | POA: Diagnosis not present

## 2012-02-15 DIAGNOSIS — K6389 Other specified diseases of intestine: Secondary | ICD-10-CM | POA: Diagnosis not present

## 2012-02-15 DIAGNOSIS — R1084 Generalized abdominal pain: Secondary | ICD-10-CM | POA: Diagnosis not present

## 2012-02-15 DIAGNOSIS — K639 Disease of intestine, unspecified: Secondary | ICD-10-CM | POA: Diagnosis not present

## 2012-02-15 DIAGNOSIS — K5730 Diverticulosis of large intestine without perforation or abscess without bleeding: Secondary | ICD-10-CM | POA: Diagnosis present

## 2012-02-15 DIAGNOSIS — F17200 Nicotine dependence, unspecified, uncomplicated: Secondary | ICD-10-CM | POA: Diagnosis present

## 2012-02-15 DIAGNOSIS — E869 Volume depletion, unspecified: Secondary | ICD-10-CM | POA: Diagnosis not present

## 2012-02-15 DIAGNOSIS — K56609 Unspecified intestinal obstruction, unspecified as to partial versus complete obstruction: Secondary | ICD-10-CM | POA: Diagnosis not present

## 2012-02-15 DIAGNOSIS — G4700 Insomnia, unspecified: Secondary | ICD-10-CM | POA: Diagnosis present

## 2012-02-15 DIAGNOSIS — K560 Paralytic ileus: Secondary | ICD-10-CM | POA: Diagnosis not present

## 2012-02-15 DIAGNOSIS — R112 Nausea with vomiting, unspecified: Secondary | ICD-10-CM | POA: Diagnosis not present

## 2012-02-26 DIAGNOSIS — R109 Unspecified abdominal pain: Secondary | ICD-10-CM | POA: Diagnosis not present

## 2012-02-26 DIAGNOSIS — R1084 Generalized abdominal pain: Secondary | ICD-10-CM | POA: Diagnosis not present

## 2012-02-26 DIAGNOSIS — K5289 Other specified noninfective gastroenteritis and colitis: Secondary | ICD-10-CM | POA: Diagnosis not present

## 2012-02-26 DIAGNOSIS — R112 Nausea with vomiting, unspecified: Secondary | ICD-10-CM | POA: Diagnosis not present

## 2012-03-04 DIAGNOSIS — R112 Nausea with vomiting, unspecified: Secondary | ICD-10-CM | POA: Diagnosis not present

## 2012-03-04 DIAGNOSIS — M5430 Sciatica, unspecified side: Secondary | ICD-10-CM | POA: Diagnosis not present

## 2012-03-04 DIAGNOSIS — M545 Low back pain: Secondary | ICD-10-CM | POA: Diagnosis not present

## 2012-04-11 DIAGNOSIS — R110 Nausea: Secondary | ICD-10-CM | POA: Diagnosis not present

## 2012-04-11 DIAGNOSIS — R10819 Abdominal tenderness, unspecified site: Secondary | ICD-10-CM | POA: Diagnosis not present

## 2012-04-11 DIAGNOSIS — R112 Nausea with vomiting, unspecified: Secondary | ICD-10-CM | POA: Diagnosis not present

## 2012-04-11 DIAGNOSIS — K2970 Gastritis, unspecified, without bleeding: Secondary | ICD-10-CM | POA: Diagnosis not present

## 2012-04-11 DIAGNOSIS — R109 Unspecified abdominal pain: Secondary | ICD-10-CM | POA: Diagnosis not present

## 2012-04-11 DIAGNOSIS — R197 Diarrhea, unspecified: Secondary | ICD-10-CM | POA: Diagnosis not present

## 2012-05-03 DIAGNOSIS — F332 Major depressive disorder, recurrent severe without psychotic features: Secondary | ICD-10-CM | POA: Diagnosis not present

## 2012-05-04 DIAGNOSIS — F332 Major depressive disorder, recurrent severe without psychotic features: Secondary | ICD-10-CM | POA: Diagnosis not present

## 2012-05-05 DIAGNOSIS — F332 Major depressive disorder, recurrent severe without psychotic features: Secondary | ICD-10-CM | POA: Diagnosis not present

## 2012-05-06 DIAGNOSIS — F332 Major depressive disorder, recurrent severe without psychotic features: Secondary | ICD-10-CM | POA: Diagnosis not present

## 2012-08-04 ENCOUNTER — Emergency Department: Payer: Self-pay | Admitting: Emergency Medicine

## 2012-08-04 DIAGNOSIS — K56609 Unspecified intestinal obstruction, unspecified as to partial versus complete obstruction: Secondary | ICD-10-CM | POA: Diagnosis not present

## 2012-08-04 DIAGNOSIS — R109 Unspecified abdominal pain: Secondary | ICD-10-CM | POA: Diagnosis not present

## 2012-08-04 DIAGNOSIS — F329 Major depressive disorder, single episode, unspecified: Secondary | ICD-10-CM | POA: Diagnosis not present

## 2012-08-04 DIAGNOSIS — K5289 Other specified noninfective gastroenteritis and colitis: Secondary | ICD-10-CM | POA: Diagnosis not present

## 2012-08-04 LAB — URINALYSIS, COMPLETE
Nitrite: NEGATIVE
Ph: 8 (ref 4.5–8.0)
Protein: NEGATIVE
Specific Gravity: 1.009 (ref 1.003–1.030)
Squamous Epithelial: <1
WBC UR: 3 /HPF (ref 0–5)

## 2012-08-04 LAB — CBC WITH DIFFERENTIAL/PLATELET
Eosinophil #: 0.1 10*3/uL (ref 0.0–0.7)
Lymphocyte #: 2.8 10*3/uL (ref 1.0–3.6)
Lymphocyte %: 40.7 %
MCV: 89 fL (ref 80–100)
Neutrophil %: 51.5 %

## 2012-08-04 LAB — COMPREHENSIVE METABOLIC PANEL
Alkaline Phosphatase: 119 U/L (ref 50–136)
Anion Gap: 6 — ABNORMAL LOW (ref 7–16)
BUN: 12 mg/dL (ref 7–18)
Bilirubin,Total: 0.3 mg/dL (ref 0.2–1.0)
EGFR (African American): >60
Potassium: 4 mmol/L (ref 3.5–5.1)
SGOT(AST): 29 U/L (ref 15–37)
Sodium: 141 mmol/L (ref 136–145)

## 2012-08-04 LAB — LIPASE, BLOOD: Lipase: 236 U/L (ref 73–393)

## 2012-08-06 ENCOUNTER — Inpatient Hospital Stay: Payer: Self-pay | Admitting: Internal Medicine

## 2012-08-06 DIAGNOSIS — K625 Hemorrhage of anus and rectum: Secondary | ICD-10-CM | POA: Diagnosis not present

## 2012-08-06 DIAGNOSIS — K5289 Other specified noninfective gastroenteritis and colitis: Secondary | ICD-10-CM | POA: Diagnosis not present

## 2012-08-06 DIAGNOSIS — E860 Dehydration: Secondary | ICD-10-CM | POA: Diagnosis not present

## 2012-08-06 DIAGNOSIS — R52 Pain, unspecified: Secondary | ICD-10-CM | POA: Diagnosis not present

## 2012-08-06 DIAGNOSIS — F329 Major depressive disorder, single episode, unspecified: Secondary | ICD-10-CM | POA: Diagnosis not present

## 2012-08-06 DIAGNOSIS — R109 Unspecified abdominal pain: Secondary | ICD-10-CM | POA: Diagnosis not present

## 2012-08-06 LAB — URINALYSIS, COMPLETE
Bacteria: NONE SEEN
Ketone: NEGATIVE
Ph: 5 (ref 4.5–8.0)
Protein: NEGATIVE
RBC,UR: 1 /HPF (ref 0–5)
WBC UR: 1 /HPF (ref 0–5)

## 2012-08-06 LAB — COMPREHENSIVE METABOLIC PANEL
Albumin: 4.5 g/dL (ref 3.4–5.0)
Anion Gap: 6 — ABNORMAL LOW (ref 7–16)
BUN: 10 mg/dL (ref 7–18)
Creatinine: 0.75 mg/dL (ref 0.60–1.30)
Glucose: 103 mg/dL — ABNORMAL HIGH (ref 65–99)
Potassium: 3.9 mmol/L (ref 3.5–5.1)
SGPT (ALT): 27 U/L (ref 12–78)

## 2012-08-06 LAB — CBC
HCT: 42.7 % (ref 35.0–47.0)
MCH: 30 pg (ref 26.0–34.0)
MCV: 89 fL (ref 80–100)
RDW: 13.7 % (ref 11.5–14.5)

## 2012-08-06 LAB — LIPASE, BLOOD: Lipase: 306 U/L (ref 73–393)

## 2012-08-07 DIAGNOSIS — K5900 Constipation, unspecified: Secondary | ICD-10-CM | POA: Diagnosis not present

## 2012-08-07 DIAGNOSIS — K625 Hemorrhage of anus and rectum: Secondary | ICD-10-CM | POA: Diagnosis not present

## 2012-08-07 DIAGNOSIS — K5289 Other specified noninfective gastroenteritis and colitis: Secondary | ICD-10-CM | POA: Diagnosis not present

## 2012-08-07 DIAGNOSIS — R109 Unspecified abdominal pain: Secondary | ICD-10-CM | POA: Diagnosis not present

## 2012-08-07 DIAGNOSIS — F329 Major depressive disorder, single episode, unspecified: Secondary | ICD-10-CM | POA: Diagnosis not present

## 2012-08-07 DIAGNOSIS — K5732 Diverticulitis of large intestine without perforation or abscess without bleeding: Secondary | ICD-10-CM | POA: Diagnosis not present

## 2012-08-07 DIAGNOSIS — R110 Nausea: Secondary | ICD-10-CM | POA: Diagnosis not present

## 2012-08-07 DIAGNOSIS — K56609 Unspecified intestinal obstruction, unspecified as to partial versus complete obstruction: Secondary | ICD-10-CM | POA: Diagnosis not present

## 2012-08-07 DIAGNOSIS — R933 Abnormal findings on diagnostic imaging of other parts of digestive tract: Secondary | ICD-10-CM | POA: Diagnosis not present

## 2012-08-07 DIAGNOSIS — E860 Dehydration: Secondary | ICD-10-CM | POA: Diagnosis not present

## 2012-08-07 LAB — COMPREHENSIVE METABOLIC PANEL
Albumin: 3.7 g/dL (ref 3.4–5.0)
Bilirubin,Total: 0.5 mg/dL (ref 0.2–1.0)
Calcium, Total: 8.3 mg/dL — ABNORMAL LOW (ref 8.5–10.1)
Chloride: 112 mmol/L — ABNORMAL HIGH (ref 98–107)
Co2: 27 mmol/L (ref 21–32)
EGFR (African American): >60
EGFR (Non-African Amer.): >60
Glucose: 92 mg/dL (ref 65–99)
Osmolality: 280 (ref 275–301)
Potassium: 3.6 mmol/L (ref 3.5–5.1)
SGOT(AST): 42 U/L — ABNORMAL HIGH (ref 15–37)
Sodium: 141 mmol/L (ref 136–145)
Total Protein: 6.9 g/dL (ref 6.4–8.2)

## 2012-08-07 LAB — CBC WITH DIFFERENTIAL/PLATELET
Eosinophil #: 0.3 10*3/uL (ref 0.0–0.7)
Eosinophil %: 3.9 %
HCT: 35.3 % (ref 35.0–47.0)
Lymphocyte #: 3.4 10*3/uL (ref 1.0–3.6)
MCH: 30.4 pg (ref 26.0–34.0)
MCV: 89 fL (ref 80–100)
Monocyte %: 7.5 %
Neutrophil #: 2.5 10*3/uL (ref 1.4–6.5)
Neutrophil %: 37.1 %
RBC: 3.95 10*6/uL (ref 3.80–5.20)
WBC: 6.6 10*3/uL (ref 3.6–11.0)

## 2012-08-07 LAB — TROPONIN I: Troponin-I: <0.02 ng/mL

## 2012-08-08 DIAGNOSIS — R109 Unspecified abdominal pain: Secondary | ICD-10-CM | POA: Diagnosis not present

## 2012-08-08 DIAGNOSIS — K5289 Other specified noninfective gastroenteritis and colitis: Secondary | ICD-10-CM | POA: Diagnosis not present

## 2012-08-08 DIAGNOSIS — K5732 Diverticulitis of large intestine without perforation or abscess without bleeding: Secondary | ICD-10-CM | POA: Diagnosis not present

## 2012-08-08 DIAGNOSIS — E860 Dehydration: Secondary | ICD-10-CM | POA: Diagnosis not present

## 2012-08-08 DIAGNOSIS — Z980 Intestinal bypass and anastomosis status: Secondary | ICD-10-CM | POA: Diagnosis not present

## 2012-08-08 DIAGNOSIS — R1084 Generalized abdominal pain: Secondary | ICD-10-CM | POA: Diagnosis not present

## 2012-08-08 DIAGNOSIS — F329 Major depressive disorder, single episode, unspecified: Secondary | ICD-10-CM | POA: Diagnosis not present

## 2012-08-08 DIAGNOSIS — K625 Hemorrhage of anus and rectum: Secondary | ICD-10-CM | POA: Diagnosis not present

## 2012-08-08 DIAGNOSIS — K560 Paralytic ileus: Secondary | ICD-10-CM | POA: Diagnosis not present

## 2012-08-08 DIAGNOSIS — R933 Abnormal findings on diagnostic imaging of other parts of digestive tract: Secondary | ICD-10-CM | POA: Diagnosis not present

## 2012-08-09 DIAGNOSIS — K5289 Other specified noninfective gastroenteritis and colitis: Secondary | ICD-10-CM | POA: Diagnosis not present

## 2012-08-09 DIAGNOSIS — F329 Major depressive disorder, single episode, unspecified: Secondary | ICD-10-CM | POA: Diagnosis not present

## 2012-08-09 DIAGNOSIS — R198 Other specified symptoms and signs involving the digestive system and abdomen: Secondary | ICD-10-CM | POA: Diagnosis not present

## 2012-08-09 DIAGNOSIS — K5732 Diverticulitis of large intestine without perforation or abscess without bleeding: Secondary | ICD-10-CM | POA: Diagnosis not present

## 2012-08-09 DIAGNOSIS — Z980 Intestinal bypass and anastomosis status: Secondary | ICD-10-CM | POA: Diagnosis not present

## 2012-08-09 DIAGNOSIS — K625 Hemorrhage of anus and rectum: Secondary | ICD-10-CM | POA: Diagnosis not present

## 2012-08-09 DIAGNOSIS — K56609 Unspecified intestinal obstruction, unspecified as to partial versus complete obstruction: Secondary | ICD-10-CM | POA: Diagnosis not present

## 2012-08-09 DIAGNOSIS — R109 Unspecified abdominal pain: Secondary | ICD-10-CM | POA: Diagnosis not present

## 2012-08-09 DIAGNOSIS — E860 Dehydration: Secondary | ICD-10-CM | POA: Diagnosis not present

## 2012-08-10 DIAGNOSIS — E860 Dehydration: Secondary | ICD-10-CM | POA: Diagnosis not present

## 2012-08-10 DIAGNOSIS — K5289 Other specified noninfective gastroenteritis and colitis: Secondary | ICD-10-CM | POA: Diagnosis not present

## 2012-08-10 DIAGNOSIS — F329 Major depressive disorder, single episode, unspecified: Secondary | ICD-10-CM | POA: Diagnosis not present

## 2012-08-10 DIAGNOSIS — K56609 Unspecified intestinal obstruction, unspecified as to partial versus complete obstruction: Secondary | ICD-10-CM | POA: Diagnosis not present

## 2012-08-10 DIAGNOSIS — K625 Hemorrhage of anus and rectum: Secondary | ICD-10-CM | POA: Diagnosis not present

## 2012-08-10 DIAGNOSIS — R198 Other specified symptoms and signs involving the digestive system and abdomen: Secondary | ICD-10-CM | POA: Diagnosis not present

## 2012-08-10 DIAGNOSIS — Z980 Intestinal bypass and anastomosis status: Secondary | ICD-10-CM | POA: Diagnosis not present

## 2012-08-10 DIAGNOSIS — R109 Unspecified abdominal pain: Secondary | ICD-10-CM | POA: Diagnosis not present

## 2012-08-10 DIAGNOSIS — K5732 Diverticulitis of large intestine without perforation or abscess without bleeding: Secondary | ICD-10-CM | POA: Diagnosis not present

## 2012-08-10 DIAGNOSIS — F3289 Other specified depressive episodes: Secondary | ICD-10-CM | POA: Diagnosis not present

## 2012-08-10 LAB — BASIC METABOLIC PANEL
Anion Gap: 4 — ABNORMAL LOW (ref 7–16)
Calcium, Total: 8.7 mg/dL (ref 8.5–10.1)
Chloride: 109 mmol/L — ABNORMAL HIGH (ref 98–107)
Creatinine: 0.79 mg/dL (ref 0.60–1.30)
EGFR (African American): >60
EGFR (Non-African Amer.): >60
Glucose: 99 mg/dL (ref 65–99)
Sodium: 140 mmol/L (ref 136–145)

## 2012-08-11 DIAGNOSIS — K5289 Other specified noninfective gastroenteritis and colitis: Secondary | ICD-10-CM | POA: Diagnosis not present

## 2012-08-11 DIAGNOSIS — E860 Dehydration: Secondary | ICD-10-CM | POA: Diagnosis not present

## 2012-08-11 DIAGNOSIS — F329 Major depressive disorder, single episode, unspecified: Secondary | ICD-10-CM | POA: Diagnosis not present

## 2012-08-11 DIAGNOSIS — K5732 Diverticulitis of large intestine without perforation or abscess without bleeding: Secondary | ICD-10-CM | POA: Diagnosis not present

## 2012-08-11 DIAGNOSIS — K625 Hemorrhage of anus and rectum: Secondary | ICD-10-CM | POA: Diagnosis not present

## 2012-08-15 DIAGNOSIS — A09 Infectious gastroenteritis and colitis, unspecified: Secondary | ICD-10-CM | POA: Diagnosis not present

## 2012-08-15 DIAGNOSIS — R109 Unspecified abdominal pain: Secondary | ICD-10-CM | POA: Diagnosis not present

## 2012-08-15 DIAGNOSIS — R198 Other specified symptoms and signs involving the digestive system and abdomen: Secondary | ICD-10-CM | POA: Diagnosis not present

## 2012-08-22 DIAGNOSIS — K5732 Diverticulitis of large intestine without perforation or abscess without bleeding: Secondary | ICD-10-CM | POA: Diagnosis not present

## 2012-08-22 DIAGNOSIS — K5730 Diverticulosis of large intestine without perforation or abscess without bleeding: Secondary | ICD-10-CM | POA: Diagnosis not present

## 2012-11-16 DIAGNOSIS — F332 Major depressive disorder, recurrent severe without psychotic features: Secondary | ICD-10-CM | POA: Diagnosis not present

## 2012-11-17 DIAGNOSIS — F332 Major depressive disorder, recurrent severe without psychotic features: Secondary | ICD-10-CM | POA: Diagnosis not present

## 2012-11-18 DIAGNOSIS — F332 Major depressive disorder, recurrent severe without psychotic features: Secondary | ICD-10-CM | POA: Diagnosis not present

## 2012-11-19 DIAGNOSIS — F332 Major depressive disorder, recurrent severe without psychotic features: Secondary | ICD-10-CM | POA: Diagnosis not present

## 2012-11-20 DIAGNOSIS — F332 Major depressive disorder, recurrent severe without psychotic features: Secondary | ICD-10-CM | POA: Diagnosis not present

## 2012-12-02 ENCOUNTER — Emergency Department (HOSPITAL_BASED_OUTPATIENT_CLINIC_OR_DEPARTMENT_OTHER): Payer: Medicare Other

## 2012-12-02 ENCOUNTER — Encounter (HOSPITAL_BASED_OUTPATIENT_CLINIC_OR_DEPARTMENT_OTHER): Payer: Self-pay | Admitting: *Deleted

## 2012-12-02 ENCOUNTER — Emergency Department (HOSPITAL_BASED_OUTPATIENT_CLINIC_OR_DEPARTMENT_OTHER)
Admission: EM | Admit: 2012-12-02 | Discharge: 2012-12-02 | Disposition: A | Discharge status: Other Acute Inpatient Hospital | Payer: Medicare Other | Attending: Emergency Medicine | Admitting: Emergency Medicine

## 2012-12-02 DIAGNOSIS — G9050 Complex regional pain syndrome I, unspecified: Secondary | ICD-10-CM | POA: Diagnosis not present

## 2012-12-02 DIAGNOSIS — K56609 Unspecified intestinal obstruction, unspecified as to partial versus complete obstruction: Secondary | ICD-10-CM | POA: Diagnosis not present

## 2012-12-02 DIAGNOSIS — K7689 Other specified diseases of liver: Secondary | ICD-10-CM | POA: Diagnosis not present

## 2012-12-02 DIAGNOSIS — R1084 Generalized abdominal pain: Secondary | ICD-10-CM | POA: Diagnosis not present

## 2012-12-02 DIAGNOSIS — Z79899 Other long term (current) drug therapy: Secondary | ICD-10-CM | POA: Insufficient documentation

## 2012-12-02 DIAGNOSIS — F17200 Nicotine dependence, unspecified, uncomplicated: Secondary | ICD-10-CM | POA: Diagnosis not present

## 2012-12-02 DIAGNOSIS — R112 Nausea with vomiting, unspecified: Secondary | ICD-10-CM | POA: Diagnosis not present

## 2012-12-02 DIAGNOSIS — Z8719 Personal history of other diseases of the digestive system: Secondary | ICD-10-CM | POA: Insufficient documentation

## 2012-12-02 DIAGNOSIS — R197 Diarrhea, unspecified: Secondary | ICD-10-CM | POA: Diagnosis not present

## 2012-12-02 DIAGNOSIS — K2900 Acute gastritis without bleeding: Secondary | ICD-10-CM | POA: Diagnosis not present

## 2012-12-02 DIAGNOSIS — R110 Nausea: Secondary | ICD-10-CM | POA: Diagnosis not present

## 2012-12-02 DIAGNOSIS — R109 Unspecified abdominal pain: Secondary | ICD-10-CM | POA: Diagnosis not present

## 2012-12-02 HISTORY — DX: Complex regional pain syndrome I, unspecified: G90.50

## 2012-12-02 HISTORY — DX: Crohn's disease of large intestine without complications: K50.10

## 2012-12-02 LAB — URINALYSIS, ROUTINE W REFLEX MICROSCOPIC
Bilirubin Urine: NEGATIVE
Ketones, ur: NEGATIVE mg/dL
Nitrite: NEGATIVE
Specific Gravity, Urine: 1.008 (ref 1.005–1.030)
Urobilinogen, UA: 0.2 mg/dL (ref 0.0–1.0)

## 2012-12-02 LAB — COMPREHENSIVE METABOLIC PANEL
ALT: 172 U/L — ABNORMAL HIGH (ref 0–35)
AST: 52 U/L — ABNORMAL HIGH (ref 0–37)
Albumin: 4.2 g/dL (ref 3.5–5.2)
Alkaline Phosphatase: 157 U/L — ABNORMAL HIGH (ref 39–117)
Calcium: 10.1 mg/dL (ref 8.4–10.5)
GFR calc Af Amer: >90 mL/min (ref >90)
GFR calc non Af Amer: >90 mL/min (ref >90)
Glucose, Bld: 102 mg/dL — ABNORMAL HIGH (ref 70–99)
Potassium: 4 mEq/L (ref 3.5–5.1)
Sodium: 141 mEq/L (ref 135–145)
Total Protein: 7.6 g/dL (ref 6.0–8.3)

## 2012-12-02 LAB — CBC WITH DIFFERENTIAL/PLATELET
Basophils Absolute: 0 10*3/uL (ref 0.0–0.1)
Basophils Relative: 1 % (ref 0–1)
Eosinophils Absolute: 0.3 10*3/uL (ref 0.0–0.7)
Eosinophils Relative: 4 % (ref 0–5)
MCH: 30.2 pg (ref 26.0–34.0)
MCHC: 34 g/dL (ref 30.0–36.0)
MCV: 88.8 fL (ref 78.0–100.0)
Neutrophils Relative %: 53 % (ref 43–77)
Platelets: 325 10*3/uL (ref 150–400)
RBC: 4.37 MIL/uL (ref 3.87–5.11)
RDW: 13.4 % (ref 11.5–15.5)

## 2012-12-02 MED ORDER — IOHEXOL 300 MG/ML  SOLN
100.0000 mL | Freq: Once | INTRAMUSCULAR | Status: AC | PRN
  Administered 2012-12-02: 100 mL via INTRAVENOUS

## 2012-12-02 MED ORDER — HYDROMORPHONE HCL PF 1 MG/ML IJ SOLN
1.0000 mg | Freq: Once | INTRAMUSCULAR | Status: AC
  Administered 2012-12-02: 1 mg via INTRAVENOUS
  Filled 2012-12-02: qty 1

## 2012-12-02 MED ORDER — ONDANSETRON HCL 4 MG/2ML IJ SOLN
4.0000 mg | Freq: Once | INTRAMUSCULAR | Status: AC
  Administered 2012-12-02: 4 mg via INTRAVENOUS
  Filled 2012-12-02: qty 2

## 2012-12-02 MED ORDER — IOHEXOL 300 MG/ML  SOLN
50.0000 mL | Freq: Once | INTRAMUSCULAR | Status: AC | PRN
  Administered 2012-12-02: 50 mL via ORAL

## 2012-12-02 MED ORDER — SODIUM CHLORIDE 0.9 % IV SOLN
Freq: Once | INTRAVENOUS | Status: AC
  Administered 2012-12-02: 18:00:00 via INTRAVENOUS

## 2012-12-02 MED ORDER — SODIUM CHLORIDE 0.9 % IV SOLN
Freq: Once | INTRAVENOUS | Status: AC
  Administered 2012-12-02: 15:00:00 via INTRAVENOUS

## 2012-12-02 MED ORDER — SODIUM CHLORIDE 0.9 % IV BOLUS (SEPSIS)
1000.0000 mL | Freq: Once | INTRAVENOUS | Status: AC
  Administered 2012-12-02: 1000 mL via INTRAVENOUS

## 2012-12-02 NOTE — ED Provider Notes (Signed)
History    CSN: [id number] Arrival date & time 12/02/12  1052  First MD Initiated Contact with Patient 12/02/12 1109     Chief Complaint  Patient presents with  . Abdominal Pain   (Consider location/radiation/quality/duration/timing/severity/associated sxs/prior Treatment) HPI Comments: Patient presents with abdominal pain, distention, diarrhea and vomiting that started this morning. She is a recovering heroin addict, clean for 22 days. She reports a history of Crohn's but is on no Crohn's medications. She was admitted 3 months ago for similar symptoms and told she had a stricture in her colon. She had a colostomy with reversal. She said 3 episodes of vomiting this morning without blood. No blood in her stool. No chest pain, back pain, dysuria or hematuria. No vaginal bleeding or discharge. PCP is [facility].  The history is provided by the patient.   Past Medical History  Diagnosis Date  . Crohn's colitis   . RSD (reflex sympathetic dystrophy)    Past Surgical History  Procedure Laterality Date  . Colon surgery    . Abdominal hysterectomy     No family history on file. History  Substance Use Topics  . Smoking status: Current Every Day Smoker -- 1.00 packs/day    Types: Cigarettes  . Smokeless tobacco: Not on file  . Alcohol Use: No   OB History   Grav Para Term Preterm Abortions TAB SAB Ect Mult Living                 Review of Systems  Constitutional: Negative for activity change and appetite change.  HENT: Negative for congestion and rhinorrhea.   Respiratory: Negative for cough, chest tightness and shortness of breath.   Cardiovascular: Negative for chest pain.  Gastrointestinal: Positive for abdominal pain. Negative for nausea and vomiting.  Genitourinary: Negative for dysuria, hematuria, vaginal bleeding and vaginal discharge.  Musculoskeletal: Negative for back pain.  Skin: Negative for rash.  Neurological: Negative for dizziness, tremors and headaches.  A  complete 10 system review of systems was obtained and all systems are negative except as noted in the HPI and PMH.    Allergies  Review of patient's allergies indicates no known allergies.  Home Medications   Current Outpatient Rx  Name  Route  Sig  Dispense  Refill  . gabapentin (NEURONTIN) 300 MG capsule   Oral   Take 300 mg by mouth 3 (three) times daily.         . Ibuprofen (MOTRIN PO)   Oral   Take by mouth.         . QUEtiapine (SEROQUEL) 200 MG tablet   Oral   Take 200 mg by mouth at bedtime.          BP 112/66  Pulse 75  Temp(Src) 98 F (36.7 C) (Oral)  Resp 20  Ht 5\' 6"  (1.676 m)  Wt 155 lb (70.308 kg)  BMI 25.03 kg/m2  SpO2 100% Physical Exam  Constitutional: She is oriented to person, place, and time. She appears well-developed and well-nourished. No distress.  HENT:  Head: Normocephalic and atraumatic.  Mouth/Throat: Oropharynx is clear and moist. No oropharyngeal exudate.  Eyes: Conjunctivae and EOM are normal. Pupils are equal, round, and reactive to light.  Neck: Normal range of motion. Neck supple.  Cardiovascular: Normal rate, regular rhythm and normal heart sounds.   Pulmonary/Chest: Effort normal and breath sounds normal. No respiratory distress.  Abdominal: Soft. There is tenderness. There is no rebound and no guarding.  Multiple well healed scars. Diffuse  tenderness without peritoneal signs  Musculoskeletal: Normal range of motion. She exhibits no edema and no tenderness.  Neurological: She is alert and oriented to person, place, and time. No cranial nerve deficit. She exhibits normal muscle tone. Coordination normal.  Skin: Skin is warm.    ED Course  Procedures (including critical care time) Labs Reviewed  COMPREHENSIVE METABOLIC PANEL - Abnormal; Notable for the following:    Glucose, Bld 102 (*)    AST 52 (*)    ALT 172 (*)    Alkaline Phosphatase 157 (*)    Total Bilirubin 0.2 (*)    All other components within normal limits   LIPASE, BLOOD - Abnormal; Notable for the following:    Lipase 101 (*)    All other components within normal limits  URINALYSIS, ROUTINE W REFLEX MICROSCOPIC  CBC WITH DIFFERENTIAL  CG4 I-STAT (LACTIC ACID)   US Abdomen Complete  12/02/2012   *RADIOLOGY REPORT*  Clinical Data:  Elevated LFTs, abdominal distention and pain  ABDOMINAL ULTRASOUND COMPLETE  Comparison:  04/11/2012  Findings:  Gallbladder:  Surgically absent  Common Bile Duct:  Within normal limits in caliber.  Liver: Mild increased echogenicity suggesting fatty infiltration. No biliary dilatation or focal hepatic abnormality.  IVC:  Appears normal.  Pancreas:  No abnormality identified.  Spleen:  Within normal limits in size and echotexture.  Right kidney:  Normal in size and parenchymal echogenicity.  No evidence of mass or hydronephrosis.  Left kidney:  Normal in size and parenchymal echogenicity.  No evidence of mass or hydronephrosis.  Abdominal Aorta:  No aneurysm identified.  IMPRESSION: Prior cholecystectomy  No biliary dilatation  Mild hepatic steatosis   Original Report Authenticated By: Judie Petit. Miles Costain, M.D.   Ct Abdomen Pelvis W Contrast  12/02/2012   *RADIOLOGY REPORT*  Clinical Data: Abdominal pain.  Nausea, vomiting and diarrhea. History of Crohn disease.  CT ABDOMEN AND PELVIS WITH CONTRAST  Technique:  Multidetector CT imaging of the abdomen and pelvis was performed following the standard protocol during bolus administration of intravenous contrast.  Contrast: OMNIPAQUE IOHEXOL 300 MG/ML  SOLN  Comparison: CT of the abdomen and pelvis 04/11/2012.  Findings:  Lung Bases: Unremarkable.  Abdomen/Pelvis:  Status post cholecystectomy.  The appearance of the liver, pancreas, spleen, bilateral adrenal glands and bilateral kidneys is unremarkable.  Marked dilatation of the third portion of the duodenum (approximately 4.7 cm in diameter), without evidence of a distal obstruction.  This persists on the delayed phase images. The stomach  does not appear distended.  Status post partial rectosigmoidectomy with anastomosis in the region of the proximal rectum.  No significant volume of ascites.  No pneumoperitoneum. No other pathologic distension of small bowel.  The distal ileum is small in caliber, but there are no overt inflammatory changes around the small bowel in this region to suggest active Crohn's disease.  No rim enhancing fluid collection within the abdomen or pelvis to suggest an abscess at this time. Status post hysterectomy.  Ovaries are not confidently identified and may be surgically absent or atrophic.  Musculoskeletal: There are no aggressive appearing lytic or blastic lesions noted in the visualized portions of the skeleton.  IMPRESSION: 1.  There is moderate dilatation of the third portion of the duodenum.  This appears to be an isolated finding and is of uncertain clinical significance.  At this time, this does not appear to be associated with other evidence of bowel obstruction, and there is no evidence of gastric distension to suggest clinically  significant obstruction at the level of the duodenum. 2.  No other potential acute findings are identified within the abdomen or pelvis at this time. 3.  Postoperative changes, as above.   Original Report Authenticated By: Trudie Reed, M.D.   Dg Abd Acute W/chest  12/02/2012   *RADIOLOGY REPORT*  Clinical Data: Abdominal pain and nausea.  ACUTE ABDOMEN SERIES (ABDOMEN 2 VIEW & CHEST 1 VIEW)  Comparison: CT scan 04/11/2012.  Findings: The upright chest x-ray is normal.  No pleural effusion.  Two views of the abdomen demonstrate a normal bowel gas pattern. No findings for obstruction and/or perforation.  The soft tissue shadows are maintained.  No worrisome calcifications.  Remote surgical changes are noted.  The bony structures are intact.  IMPRESSION:  1.  No acute cardiopulmonary findings. 2.  No plain film findings for an acute abdominal process.   Original Report Authenticated  By: Rudie Meyer, M.D.   1. Abdominal pain   2. Nausea and vomiting     MDM  Abdominal pain, nausea, vomiting, diarrhea since this morning.  Hx Crohn's, colostomy with reversal.   Abdomen tender without peritoneal signs.  Urinalysis negative. Mild transaminitis LFTs with lipase of 101. Patient with previous cholecystectomy. Ultrasound shows no biliary duct dilation. No leukocytosis.  Pain, nausea and vomiting persists. Proceeded with CT scan which shows no colitis. There is dilation of her duodenum which is nonspecific. No bowel obstruction.   Patient continues to have pain. Does not appear to be from her Crohn's disease. Question pancreatitis. Will need admission for pain control, IV fluids and antiemetics and further workup of her abnormal CT findings. Discussed with Dr. Yetta Barre at [facility]  Glynn Octave, MD 12/02/12 [phone]

## 2012-12-02 NOTE — ED Notes (Signed)
Day Loraine Leriche called with room #

## 2012-12-02 NOTE — ED Notes (Signed)
Abdominal pain, distention, diarrhea and vomiting since this am. States she was admitted to [facility] in April for same symptoms. She is a patient at [facility] for heroin addiction.

## 2012-12-02 NOTE — ED Notes (Signed)
Returned from [facility]. Requesting pain medication.

## 2012-12-03 DIAGNOSIS — R109 Unspecified abdominal pain: Secondary | ICD-10-CM | POA: Diagnosis not present

## 2012-12-04 DIAGNOSIS — E876 Hypokalemia: Secondary | ICD-10-CM | POA: Diagnosis not present

## 2012-12-04 DIAGNOSIS — R112 Nausea with vomiting, unspecified: Secondary | ICD-10-CM | POA: Diagnosis not present

## 2012-12-04 DIAGNOSIS — Z23 Encounter for immunization: Secondary | ICD-10-CM | POA: Diagnosis not present

## 2012-12-04 DIAGNOSIS — R109 Unspecified abdominal pain: Secondary | ICD-10-CM | POA: Diagnosis not present

## 2012-12-04 DIAGNOSIS — F17200 Nicotine dependence, unspecified, uncomplicated: Secondary | ICD-10-CM | POA: Diagnosis not present

## 2013-02-21 ENCOUNTER — Emergency Department: Payer: Self-pay | Admitting: Emergency Medicine

## 2013-02-21 DIAGNOSIS — K5900 Constipation, unspecified: Secondary | ICD-10-CM | POA: Diagnosis not present

## 2013-02-21 DIAGNOSIS — R109 Unspecified abdominal pain: Secondary | ICD-10-CM | POA: Diagnosis not present

## 2013-02-21 LAB — URINALYSIS, COMPLETE
Bacteria: NONE SEEN
Bilirubin,UR: NEGATIVE
Blood: NEGATIVE
Ketone: NEGATIVE
Leukocyte Esterase: NEGATIVE
Nitrite: NEGATIVE
Protein: NEGATIVE
RBC,UR: 3 /HPF (ref 0–5)
Specific Gravity: 1.019 (ref 1.003–1.030)
WBC UR: <1 /HPF (ref 0–5)

## 2013-02-21 LAB — COMPREHENSIVE METABOLIC PANEL
Anion Gap: 7 (ref 7–16)
BUN: 14 mg/dL (ref 7–18)
Calcium, Total: 9.4 mg/dL (ref 8.5–10.1)
Chloride: 107 mmol/L (ref 98–107)
Co2: 23 mmol/L (ref 21–32)
EGFR (African American): >60
Glucose: 110 mg/dL — ABNORMAL HIGH (ref 65–99)
Osmolality: 275 (ref 275–301)
Potassium: 4.4 mmol/L (ref 3.5–5.1)
SGOT(AST): 34 U/L (ref 15–37)
SGPT (ALT): 31 U/L (ref 12–78)

## 2013-02-21 LAB — CBC
HGB: 14.4 g/dL (ref 12.0–16.0)
RBC: 4.82 10*6/uL (ref 3.80–5.20)
RDW: 14.6 % — ABNORMAL HIGH (ref 11.5–14.5)

## 2013-03-03 DIAGNOSIS — R109 Unspecified abdominal pain: Secondary | ICD-10-CM | POA: Diagnosis not present

## 2013-03-03 DIAGNOSIS — K5900 Constipation, unspecified: Secondary | ICD-10-CM | POA: Diagnosis not present

## 2013-03-07 DIAGNOSIS — R109 Unspecified abdominal pain: Secondary | ICD-10-CM | POA: Diagnosis not present

## 2013-03-07 DIAGNOSIS — K5730 Diverticulosis of large intestine without perforation or abscess without bleeding: Secondary | ICD-10-CM | POA: Diagnosis not present

## 2013-03-07 DIAGNOSIS — K5732 Diverticulitis of large intestine without perforation or abscess without bleeding: Secondary | ICD-10-CM | POA: Diagnosis not present

## 2013-03-07 DIAGNOSIS — Z933 Colostomy status: Secondary | ICD-10-CM | POA: Diagnosis not present

## 2013-03-13 DIAGNOSIS — K5900 Constipation, unspecified: Secondary | ICD-10-CM | POA: Diagnosis not present

## 2013-03-13 DIAGNOSIS — K5289 Other specified noninfective gastroenteritis and colitis: Secondary | ICD-10-CM | POA: Diagnosis not present

## 2013-03-13 DIAGNOSIS — K56609 Unspecified intestinal obstruction, unspecified as to partial versus complete obstruction: Secondary | ICD-10-CM | POA: Diagnosis not present

## 2013-03-13 DIAGNOSIS — K922 Gastrointestinal hemorrhage, unspecified: Secondary | ICD-10-CM | POA: Diagnosis not present

## 2013-03-13 DIAGNOSIS — K2940 Chronic atrophic gastritis without bleeding: Secondary | ICD-10-CM | POA: Diagnosis not present

## 2013-03-13 DIAGNOSIS — K921 Melena: Secondary | ICD-10-CM | POA: Diagnosis not present

## 2013-03-13 DIAGNOSIS — K6389 Other specified diseases of intestine: Secondary | ICD-10-CM | POA: Diagnosis not present

## 2013-03-27 ENCOUNTER — Observation Stay: Payer: Self-pay | Admitting: Student

## 2013-03-27 DIAGNOSIS — F341 Dysthymic disorder: Secondary | ICD-10-CM | POA: Diagnosis not present

## 2013-03-27 DIAGNOSIS — F411 Generalized anxiety disorder: Secondary | ICD-10-CM | POA: Diagnosis not present

## 2013-03-27 DIAGNOSIS — R51 Headache: Secondary | ICD-10-CM | POA: Diagnosis not present

## 2013-03-27 DIAGNOSIS — F4310 Post-traumatic stress disorder, unspecified: Secondary | ICD-10-CM | POA: Diagnosis not present

## 2013-03-27 DIAGNOSIS — M549 Dorsalgia, unspecified: Secondary | ICD-10-CM | POA: Diagnosis not present

## 2013-03-27 DIAGNOSIS — Z87442 Personal history of urinary calculi: Secondary | ICD-10-CM | POA: Diagnosis not present

## 2013-03-27 DIAGNOSIS — M79609 Pain in unspecified limb: Secondary | ICD-10-CM | POA: Diagnosis not present

## 2013-03-27 DIAGNOSIS — R52 Pain, unspecified: Secondary | ICD-10-CM | POA: Diagnosis not present

## 2013-03-27 DIAGNOSIS — R0609 Other forms of dyspnea: Secondary | ICD-10-CM | POA: Diagnosis not present

## 2013-03-27 DIAGNOSIS — F17200 Nicotine dependence, unspecified, uncomplicated: Secondary | ICD-10-CM | POA: Diagnosis not present

## 2013-03-27 DIAGNOSIS — R0602 Shortness of breath: Secondary | ICD-10-CM | POA: Diagnosis not present

## 2013-03-27 DIAGNOSIS — F329 Major depressive disorder, single episode, unspecified: Secondary | ICD-10-CM | POA: Diagnosis not present

## 2013-03-27 DIAGNOSIS — R079 Chest pain, unspecified: Secondary | ICD-10-CM | POA: Diagnosis not present

## 2013-03-27 DIAGNOSIS — Z8249 Family history of ischemic heart disease and other diseases of the circulatory system: Secondary | ICD-10-CM | POA: Diagnosis not present

## 2013-03-27 DIAGNOSIS — M542 Cervicalgia: Secondary | ICD-10-CM | POA: Diagnosis not present

## 2013-03-27 DIAGNOSIS — R0789 Other chest pain: Secondary | ICD-10-CM | POA: Diagnosis not present

## 2013-03-27 DIAGNOSIS — F410 Panic disorder [episodic paroxysmal anxiety] without agoraphobia: Secondary | ICD-10-CM | POA: Diagnosis not present

## 2013-03-27 LAB — COMPREHENSIVE METABOLIC PANEL WITH GFR
Albumin: 4.2 g/dL
Alkaline Phosphatase: 111 U/L
Anion Gap: 5 — ABNORMAL LOW
BUN: 12 mg/dL
Bilirubin,Total: 0.3 mg/dL
Calcium, Total: 9 mg/dL
Chloride: 111 mmol/L — ABNORMAL HIGH
Co2: 23 mmol/L
Creatinine: 0.87 mg/dL
EGFR (African American): >60
EGFR (Non-African Amer.): >60
Glucose: 77 mg/dL
Osmolality: 276
Potassium: 3.6 mmol/L
SGOT(AST): 30 U/L
SGPT (ALT): 29 U/L
Sodium: 139 mmol/L
Total Protein: 8 g/dL

## 2013-03-27 LAB — CK TOTAL AND CKMB (NOT AT ARMC)
CK, Total: 79 U/L (ref 21–215)
CK, Total: 76 U/L (ref 21–215)

## 2013-03-27 LAB — URINALYSIS, COMPLETE
Bacteria: NONE SEEN
Blood: NEGATIVE
Ketone: NEGATIVE
Leukocyte Esterase: NEGATIVE
Ph: 5 (ref 4.5–8.0)
Specific Gravity: 1.024 (ref 1.003–1.030)
WBC UR: 1 /HPF (ref 0–5)

## 2013-03-27 LAB — CBC
MCV: 88 fL (ref 80–100)
Platelet: 301 10*3/uL (ref 150–440)
RBC: 4.33 10*6/uL (ref 3.80–5.20)
WBC: 6.3 10*3/uL (ref 3.6–11.0)

## 2013-03-27 LAB — DRUG SCREEN, URINE
Barbiturates, Ur Screen: NEGATIVE (ref ?–200)
Benzodiazepine, Ur Scrn: NEGATIVE (ref ?–200)
Cannabinoid 50 Ng, Ur ~~LOC~~: POSITIVE (ref ?–50)
Cocaine Metabolite,Ur ~~LOC~~: NEGATIVE (ref ?–300)
Methadone, Ur Screen: NEGATIVE (ref ?–300)
Phencyclidine (PCP) Ur S: NEGATIVE (ref ?–25)
Tricyclic, Ur Screen: POSITIVE (ref ?–1000)

## 2013-03-28 DIAGNOSIS — R0609 Other forms of dyspnea: Secondary | ICD-10-CM | POA: Diagnosis not present

## 2013-03-28 DIAGNOSIS — R079 Chest pain, unspecified: Secondary | ICD-10-CM | POA: Diagnosis not present

## 2013-03-28 DIAGNOSIS — F341 Dysthymic disorder: Secondary | ICD-10-CM | POA: Diagnosis not present

## 2013-03-28 LAB — LIPID PANEL
Triglycerides: 152 mg/dL (ref 0–200)
VLDL Cholesterol, Calc: 30 mg/dL (ref 5–40)

## 2013-03-28 LAB — CBC WITH DIFFERENTIAL/PLATELET
Basophil %: 0.6 %
Eosinophil %: 3.3 %
HCT: 34.9 % — ABNORMAL LOW (ref 35.0–47.0)
MCH: 30.3 pg (ref 26.0–34.0)
MCHC: 34.1 g/dL (ref 32.0–36.0)
MCV: 89 fL (ref 80–100)
Monocyte #: 0.3 x10 3/mm (ref 0.2–0.9)
RDW: 14.1 % (ref 11.5–14.5)

## 2013-03-28 LAB — BASIC METABOLIC PANEL
Anion Gap: 5 — ABNORMAL LOW (ref 7–16)
Chloride: 108 mmol/L — ABNORMAL HIGH (ref 98–107)
Co2: 25 mmol/L (ref 21–32)
Glucose: 88 mg/dL (ref 65–99)
Sodium: 138 mmol/L (ref 136–145)

## 2013-05-13 ENCOUNTER — Emergency Department: Payer: Self-pay | Admitting: Internal Medicine

## 2013-05-13 DIAGNOSIS — F4310 Post-traumatic stress disorder, unspecified: Secondary | ICD-10-CM | POA: Diagnosis not present

## 2013-05-13 DIAGNOSIS — M5137 Other intervertebral disc degeneration, lumbosacral region: Secondary | ICD-10-CM | POA: Diagnosis not present

## 2013-05-13 DIAGNOSIS — Z9089 Acquired absence of other organs: Secondary | ICD-10-CM | POA: Diagnosis not present

## 2013-05-13 DIAGNOSIS — F17200 Nicotine dependence, unspecified, uncomplicated: Secondary | ICD-10-CM | POA: Diagnosis not present

## 2013-05-13 DIAGNOSIS — G4700 Insomnia, unspecified: Secondary | ICD-10-CM | POA: Diagnosis not present

## 2013-05-13 DIAGNOSIS — M5430 Sciatica, unspecified side: Secondary | ICD-10-CM | POA: Diagnosis not present

## 2013-05-13 DIAGNOSIS — Z87440 Personal history of urinary (tract) infections: Secondary | ICD-10-CM | POA: Diagnosis not present

## 2013-05-13 DIAGNOSIS — IMO0002 Reserved for concepts with insufficient information to code with codable children: Secondary | ICD-10-CM | POA: Diagnosis not present

## 2013-05-16 ENCOUNTER — Emergency Department: Payer: Self-pay | Admitting: Emergency Medicine

## 2013-05-16 DIAGNOSIS — G4700 Insomnia, unspecified: Secondary | ICD-10-CM | POA: Diagnosis not present

## 2013-05-16 DIAGNOSIS — F17200 Nicotine dependence, unspecified, uncomplicated: Secondary | ICD-10-CM | POA: Diagnosis not present

## 2013-05-16 DIAGNOSIS — M545 Low back pain, unspecified: Secondary | ICD-10-CM | POA: Diagnosis not present

## 2013-05-16 DIAGNOSIS — F4310 Post-traumatic stress disorder, unspecified: Secondary | ICD-10-CM | POA: Diagnosis not present

## 2013-05-16 DIAGNOSIS — Z79899 Other long term (current) drug therapy: Secondary | ICD-10-CM | POA: Diagnosis not present

## 2013-05-16 DIAGNOSIS — R52 Pain, unspecified: Secondary | ICD-10-CM | POA: Diagnosis not present

## 2013-05-16 DIAGNOSIS — Z9089 Acquired absence of other organs: Secondary | ICD-10-CM | POA: Diagnosis not present

## 2013-05-22 DIAGNOSIS — M545 Low back pain, unspecified: Secondary | ICD-10-CM | POA: Diagnosis not present

## 2013-05-22 DIAGNOSIS — M5430 Sciatica, unspecified side: Secondary | ICD-10-CM | POA: Diagnosis not present

## 2013-05-29 DIAGNOSIS — M47817 Spondylosis without myelopathy or radiculopathy, lumbosacral region: Secondary | ICD-10-CM | POA: Diagnosis not present

## 2013-06-10 ENCOUNTER — Emergency Department: Payer: Self-pay | Admitting: Emergency Medicine

## 2013-06-10 DIAGNOSIS — Z8489 Family history of other specified conditions: Secondary | ICD-10-CM | POA: Diagnosis not present

## 2013-06-10 DIAGNOSIS — Z79899 Other long term (current) drug therapy: Secondary | ICD-10-CM | POA: Diagnosis not present

## 2013-06-10 DIAGNOSIS — R55 Syncope and collapse: Secondary | ICD-10-CM | POA: Diagnosis not present

## 2013-06-10 DIAGNOSIS — Z90710 Acquired absence of both cervix and uterus: Secondary | ICD-10-CM | POA: Diagnosis not present

## 2013-06-10 DIAGNOSIS — F17200 Nicotine dependence, unspecified, uncomplicated: Secondary | ICD-10-CM | POA: Diagnosis not present

## 2013-06-10 DIAGNOSIS — Z9089 Acquired absence of other organs: Secondary | ICD-10-CM | POA: Diagnosis not present

## 2013-06-10 DIAGNOSIS — R079 Chest pain, unspecified: Secondary | ICD-10-CM | POA: Diagnosis not present

## 2013-06-10 LAB — BASIC METABOLIC PANEL
Anion Gap: 7 (ref 7–16)
BUN: 23 mg/dL — AB (ref 7–18)
CHLORIDE: 106 mmol/L (ref 98–107)
Calcium, Total: 9.1 mg/dL (ref 8.5–10.1)
Co2: 23 mmol/L (ref 21–32)
Creatinine: 0.95 mg/dL (ref 0.60–1.30)
GLUCOSE: 85 mg/dL (ref 65–99)
Osmolality: 275 (ref 275–301)
Potassium: 3.6 mmol/L (ref 3.5–5.1)
Sodium: 136 mmol/L (ref 136–145)

## 2013-06-10 LAB — CBC
HCT: 38.3 % (ref 35.0–47.0)
HGB: 12.9 g/dL (ref 12.0–16.0)
MCH: 30.9 pg (ref 26.0–34.0)
MCHC: 33.7 g/dL (ref 32.0–36.0)
MCV: 92 fL (ref 80–100)
PLATELETS: 281 10*3/uL (ref 150–440)
RBC: 4.18 10*6/uL (ref 3.80–5.20)
RDW: 13.9 % (ref 11.5–14.5)
WBC: 7.3 10*3/uL (ref 3.6–11.0)

## 2013-06-10 LAB — URINALYSIS, COMPLETE
Bacteria: NONE SEEN
Bilirubin,UR: NEGATIVE
Glucose,UR: NEGATIVE mg/dL (ref 0–75)
Ketone: NEGATIVE
Leukocyte Esterase: NEGATIVE
Nitrite: NEGATIVE
PROTEIN: NEGATIVE
Ph: 5 (ref 4.5–8.0)
SQUAMOUS EPITHELIAL: NONE SEEN
Specific Gravity: 1.021 (ref 1.003–1.030)
WBC UR: 3 /HPF (ref 0–5)

## 2013-06-10 LAB — TROPONIN I

## 2013-06-10 LAB — DRUG SCREEN, URINE
Amphetamines, Ur Screen: NEGATIVE (ref ?–1000)
BENZODIAZEPINE, UR SCRN: NEGATIVE (ref ?–200)
Barbiturates, Ur Screen: NEGATIVE (ref ?–200)
CANNABINOID 50 NG, UR ~~LOC~~: NEGATIVE (ref ?–50)
COCAINE METABOLITE, UR ~~LOC~~: NEGATIVE (ref ?–300)
MDMA (Ecstasy)Ur Screen: NEGATIVE (ref ?–500)
Methadone, Ur Screen: NEGATIVE (ref ?–300)
Opiate, Ur Screen: NEGATIVE (ref ?–300)
Phencyclidine (PCP) Ur S: NEGATIVE (ref ?–25)
Tricyclic, Ur Screen: NEGATIVE (ref ?–1000)

## 2013-06-10 LAB — AMMONIA: Ammonia, Plasma: 23 mcmol/L (ref 11–32)

## 2013-11-23 DIAGNOSIS — R0789 Other chest pain: Secondary | ICD-10-CM | POA: Diagnosis not present

## 2013-11-23 DIAGNOSIS — R079 Chest pain, unspecified: Secondary | ICD-10-CM | POA: Diagnosis not present

## 2013-11-23 DIAGNOSIS — R0602 Shortness of breath: Secondary | ICD-10-CM | POA: Diagnosis not present

## 2013-11-23 DIAGNOSIS — K5289 Other specified noninfective gastroenteritis and colitis: Secondary | ICD-10-CM | POA: Diagnosis not present

## 2013-11-24 DIAGNOSIS — K5289 Other specified noninfective gastroenteritis and colitis: Secondary | ICD-10-CM | POA: Diagnosis not present

## 2013-11-24 DIAGNOSIS — F17200 Nicotine dependence, unspecified, uncomplicated: Secondary | ICD-10-CM | POA: Diagnosis not present

## 2013-11-24 DIAGNOSIS — R079 Chest pain, unspecified: Secondary | ICD-10-CM | POA: Diagnosis not present

## 2013-11-24 DIAGNOSIS — R0789 Other chest pain: Secondary | ICD-10-CM | POA: Diagnosis not present

## 2013-11-26 DIAGNOSIS — IMO0002 Reserved for concepts with insufficient information to code with codable children: Secondary | ICD-10-CM | POA: Diagnosis not present

## 2013-11-26 DIAGNOSIS — R110 Nausea: Secondary | ICD-10-CM | POA: Diagnosis not present

## 2013-11-27 DIAGNOSIS — Z9889 Other specified postprocedural states: Secondary | ICD-10-CM | POA: Diagnosis not present

## 2013-11-27 DIAGNOSIS — IMO0002 Reserved for concepts with insufficient information to code with codable children: Secondary | ICD-10-CM | POA: Diagnosis not present

## 2013-11-27 DIAGNOSIS — Z4800 Encounter for change or removal of nonsurgical wound dressing: Secondary | ICD-10-CM | POA: Diagnosis not present

## 2013-11-27 DIAGNOSIS — R110 Nausea: Secondary | ICD-10-CM | POA: Diagnosis not present

## 2013-12-24 DIAGNOSIS — R079 Chest pain, unspecified: Secondary | ICD-10-CM | POA: Diagnosis not present

## 2014-01-01 DIAGNOSIS — Z8719 Personal history of other diseases of the digestive system: Secondary | ICD-10-CM | POA: Diagnosis not present

## 2014-01-01 DIAGNOSIS — R112 Nausea with vomiting, unspecified: Secondary | ICD-10-CM | POA: Diagnosis not present

## 2014-01-02 DIAGNOSIS — R112 Nausea with vomiting, unspecified: Secondary | ICD-10-CM | POA: Diagnosis not present

## 2014-01-05 DIAGNOSIS — R319 Hematuria, unspecified: Secondary | ICD-10-CM | POA: Diagnosis not present

## 2014-01-05 DIAGNOSIS — Z7189 Other specified counseling: Secondary | ICD-10-CM | POA: Diagnosis not present

## 2014-01-05 DIAGNOSIS — K5730 Diverticulosis of large intestine without perforation or abscess without bleeding: Secondary | ICD-10-CM | POA: Diagnosis not present

## 2014-01-05 DIAGNOSIS — B49 Unspecified mycosis: Secondary | ICD-10-CM | POA: Diagnosis not present

## 2014-01-05 DIAGNOSIS — F17200 Nicotine dependence, unspecified, uncomplicated: Secondary | ICD-10-CM | POA: Diagnosis not present

## 2014-01-06 DIAGNOSIS — K2970 Gastritis, unspecified, without bleeding: Secondary | ICD-10-CM | POA: Diagnosis not present

## 2014-01-06 DIAGNOSIS — R112 Nausea with vomiting, unspecified: Secondary | ICD-10-CM | POA: Diagnosis not present

## 2014-01-06 DIAGNOSIS — K2960 Other gastritis without bleeding: Secondary | ICD-10-CM | POA: Diagnosis not present

## 2014-01-06 DIAGNOSIS — K2990 Gastroduodenitis, unspecified, without bleeding: Secondary | ICD-10-CM | POA: Diagnosis not present

## 2014-01-15 DIAGNOSIS — R42 Dizziness and giddiness: Secondary | ICD-10-CM | POA: Diagnosis not present

## 2014-01-15 DIAGNOSIS — R55 Syncope and collapse: Secondary | ICD-10-CM | POA: Diagnosis not present

## 2014-01-15 DIAGNOSIS — R002 Palpitations: Secondary | ICD-10-CM | POA: Diagnosis not present

## 2014-02-24 DIAGNOSIS — Z79899 Other long term (current) drug therapy: Secondary | ICD-10-CM | POA: Diagnosis not present

## 2014-03-25 DIAGNOSIS — G8929 Other chronic pain: Secondary | ICD-10-CM | POA: Diagnosis not present

## 2014-03-25 DIAGNOSIS — Z79899 Other long term (current) drug therapy: Secondary | ICD-10-CM | POA: Diagnosis not present

## 2014-04-15 DIAGNOSIS — M25552 Pain in left hip: Secondary | ICD-10-CM | POA: Diagnosis not present

## 2014-04-15 DIAGNOSIS — M722 Plantar fascial fibromatosis: Secondary | ICD-10-CM | POA: Diagnosis not present

## 2014-04-17 DIAGNOSIS — M25552 Pain in left hip: Secondary | ICD-10-CM | POA: Diagnosis not present

## 2014-04-17 DIAGNOSIS — E785 Hyperlipidemia, unspecified: Secondary | ICD-10-CM | POA: Diagnosis not present

## 2014-04-17 DIAGNOSIS — F17210 Nicotine dependence, cigarettes, uncomplicated: Secondary | ICD-10-CM | POA: Diagnosis not present

## 2014-04-23 DIAGNOSIS — Z79899 Other long term (current) drug therapy: Secondary | ICD-10-CM | POA: Diagnosis not present

## 2014-04-24 DIAGNOSIS — M25552 Pain in left hip: Secondary | ICD-10-CM | POA: Diagnosis not present

## 2014-05-25 DIAGNOSIS — Z79899 Other long term (current) drug therapy: Secondary | ICD-10-CM | POA: Diagnosis not present

## 2014-06-10 DIAGNOSIS — M545 Low back pain: Secondary | ICD-10-CM | POA: Diagnosis not present

## 2014-06-12 DIAGNOSIS — R600 Localized edema: Secondary | ICD-10-CM | POA: Diagnosis not present

## 2014-06-12 DIAGNOSIS — M25572 Pain in left ankle and joints of left foot: Secondary | ICD-10-CM | POA: Diagnosis not present

## 2014-06-12 DIAGNOSIS — Z886 Allergy status to analgesic agent status: Secondary | ICD-10-CM | POA: Diagnosis not present

## 2014-06-12 DIAGNOSIS — Z90710 Acquired absence of both cervix and uterus: Secondary | ICD-10-CM | POA: Diagnosis not present

## 2014-06-12 DIAGNOSIS — E785 Hyperlipidemia, unspecified: Secondary | ICD-10-CM | POA: Diagnosis not present

## 2014-06-12 DIAGNOSIS — Z79899 Other long term (current) drug therapy: Secondary | ICD-10-CM | POA: Diagnosis not present

## 2014-06-12 DIAGNOSIS — M79672 Pain in left foot: Secondary | ICD-10-CM | POA: Diagnosis not present

## 2014-06-12 DIAGNOSIS — F17210 Nicotine dependence, cigarettes, uncomplicated: Secondary | ICD-10-CM | POA: Diagnosis not present

## 2014-06-12 DIAGNOSIS — Z888 Allergy status to other drugs, medicaments and biological substances status: Secondary | ICD-10-CM | POA: Diagnosis not present

## 2014-06-12 DIAGNOSIS — S99912A Unspecified injury of left ankle, initial encounter: Secondary | ICD-10-CM | POA: Diagnosis not present

## 2014-06-16 DIAGNOSIS — Y92009 Unspecified place in unspecified non-institutional (private) residence as the place of occurrence of the external cause: Secondary | ICD-10-CM | POA: Diagnosis not present

## 2014-06-16 DIAGNOSIS — S93402A Sprain of unspecified ligament of left ankle, initial encounter: Secondary | ICD-10-CM | POA: Diagnosis not present

## 2014-06-16 DIAGNOSIS — W19XXXA Unspecified fall, initial encounter: Secondary | ICD-10-CM | POA: Diagnosis not present

## 2014-07-09 DIAGNOSIS — Z79899 Other long term (current) drug therapy: Secondary | ICD-10-CM | POA: Diagnosis not present

## 2014-08-06 DIAGNOSIS — Z79899 Other long term (current) drug therapy: Secondary | ICD-10-CM | POA: Diagnosis not present

## 2014-09-02 DIAGNOSIS — G8929 Other chronic pain: Secondary | ICD-10-CM | POA: Diagnosis not present

## 2014-09-02 DIAGNOSIS — M5442 Lumbago with sciatica, left side: Secondary | ICD-10-CM | POA: Diagnosis not present

## 2014-09-02 DIAGNOSIS — M5441 Lumbago with sciatica, right side: Secondary | ICD-10-CM | POA: Diagnosis not present

## 2014-09-04 NOTE — Consult Note (Signed)
Psychiatry: Consult for this patient with a past history of depression who is currently in the hospital for treatment of colitis. Patient was awake and alert when I came to see her. She was cooperative with the interview. She reports that she is feeling very sick with pain in her abdomen and is frustrated at the severity of her colitis. Her tube is uncomfortable. She denies however that she has been feeling depressed. She says that her mood has been okay except for being so sick. She sleeps poorly at night. She denies any suicidal ideation. Describes herself as functioning at about her usual baseline at home when she is not having acute colitis. has a past history of depression with a bipolar-like character to it. Prior admission to the hospital she had taken Seroquel and tolerated it well. She reports to me that she thought it was very helpful for sleep and mood. status exam: Sick appearing woman cooperative with the interview. Good eye contact. Normal psychomotor activity given her situation. Speech quiet but easy to understand. Affect flat because she is so sick. Denies suicidal or homicidal ideation. Denies hallucinations. Good judgment and insight overall normal intelligence. with a past history of depression currently has minimal symptoms of mood disturbance except for the obvious ones it would be caused by her acute discomfort. She does have chronic difficulty sleeping. Patient has not been following up with any outpatient psychiatric treatment. She does think that the Seroquel was helpful in the past and would like to consider restarting it. the patient is not able to take any medicines by mouth because of her NG tube and colitis. Once she is able to take something by mouth we can restart Seroquel. Patient was completely agreeable to the plan. Psychoeducation and supportive therapy done. I will followup with her in the hospital.  Electronic Signatures: Clapacs, Madie Reno (MD)  (Signed on 27-Mar-14  00:03)  Authored  Last Updated: 27-Mar-14 00:03 by Gonzella Lex (MD)

## 2014-09-04 NOTE — Consult Note (Signed)
Details:   - Psychiatry: Followup with this patient with a history of depression. She was awake and alert when I saw her today. She continues to primarily complain of her pain in her abdomen. She denies feeling particularly depressed. Denies suicidal ideation. She is not sleeping well. Her affect is dysphoric and looks uncomfortable. No evidence of thought disorder.  Patient continues to have an NG tube in. The chart says that she is taking a clear liquid diet but the patient tells me that she has not had any sips of any liquids today.  Plan is to put in an order for Seroquel 100 mg at night. Previously she had been taking as much as 300 mg at night but I would start at a lower dose to avoid oversedation or other side effects. If the patient is not able to take p.o. we can just hold off on this. If she is able to swallow it that would be fine. I will continue to follow. Patient informed of the plan and agrees.   Electronic Signatures: Gonzella Lex (MD)  (Signed 27-Mar-14 17:28)  Authored: Details   Last Updated: 27-Mar-14 17:28 by Gonzella Lex (MD)

## 2014-09-04 NOTE — Consult Note (Signed)
Details:   - Psychiatry: Followup visit with this patient who has a history of depression and possible bipolar disorder. When I came to see her today she was vomiting into an emesis bin on her bed. She indicated to me that she had vomited several times that morning. She looked pretty worn out by it. Her nasogastric tube had been removed. At this point she continues to be too physically sick to really make any evaluation regarding depression. It would not hurt to continue trying to give her the Seroquel if it is possible for her to take it but if she is not able to take oral medications that can be deferred. I will try to followup as appropriate.   Electronic Signatures: Gonzella Lex (MD)  (Signed 29-Mar-14 17:53)  Authored: Details   Last Updated: 29-Mar-14 17:53 by Gonzella Lex (MD)

## 2014-09-04 NOTE — Consult Note (Signed)
Chief Complaint:  Subjective/Chief Complaint seen for abdominal pain.  patient tolerating clears.  no n/v, abd pain about 5/10.  abd pain seems to wax and wane.   VITAL SIGNS/ANCILLARY NOTES: **Vital Signs.:   28-Mar-14 20:00  Vital Signs Type Routine  Temperature Temperature (F) 98.2  Celsius 36.7  Temperature Source oral  Pulse Pulse 62  Respirations Respirations 18  Systolic BP Systolic BP 275  Diastolic BP (mmHg) Diastolic BP (mmHg) 66  Mean BP 80  Pulse Ox % Pulse Ox % 96  Pulse Ox Activity Level  At rest  Oxygen Delivery Room Air/ 21 %   Brief Assessment:  Cardiac Regular   Respiratory clear BS   Gastrointestinal details normal mild distension, diffuse tedernesss, no rebound, bowel sounds positive/muted   Lab Results: Hepatic:  26-Mar-14 03:07   Bilirubin, Total 0.5  Alkaline Phosphatase 97  SGPT (ALT) 36  SGOT (AST)  42  Total Protein, Serum 6.9  Albumin, Serum 3.7  Routine Chem:  26-Mar-14 03:07   Glucose, Serum 92  BUN 11  Creatinine (comp) 0.82  Sodium, Serum 141  Potassium, Serum 3.6  Chloride, Serum  112  CO2, Serum 27  Calcium (Total), Serum  8.3  Osmolality (calc) 280  eGFR (African American) >60  eGFR (Non-African American) >60 (eGFR values <81m/min/1.73 m2 may be an indication of chronic kidney disease (CKD). Calculated eGFR is useful in patients with stable renal function. The eGFR calculation will not be reliable in acutely ill patients when serum creatinine is changing rapidly. It is not useful in  patients on dialysis. The eGFR calculation may not be applicable to patients at the low and high extremes of body sizes, pregnant women, and vegetarians.)  Anion Gap  2  Cardiac:  26-Mar-14 03:07   Troponin I < 0.02 (0.00-0.05 0.05 ng/mL or less: NEGATIVE  Repeat testing in 3-6 hrs  if clinically indicated. >0.05 ng/mL: POTENTIAL  MYOCARDIAL INJURY. Repeat  testing in 3-6 hrs if  clinically indicated. NOTE: An increase or decrease  of  30% or more on serial  testing suggests a  clinically important change)    10:50   Troponin I < 0.02 (0.00-0.05 0.05 ng/mL or less: NEGATIVE  Repeat testing in 3-6 hrs  if clinically indicated. >0.05 ng/mL: POTENTIAL  MYOCARDIAL INJURY. Repeat  testing in 3-6 hrs if  clinically indicated. NOTE: An increase or decrease  of 30% or more on serial  testing suggests a  clinically important change)    19:17   Troponin I < 0.02 (0.00-0.05 0.05 ng/mL or less: NEGATIVE  Repeat testing in 3-6 hrs  if clinically indicated. >0.05 ng/mL: POTENTIAL  MYOCARDIAL INJURY. Repeat  testing in 3-6 hrs if  clinically indicated. NOTE: An increase or decrease  of 30% or more on serial  testing suggests a  clinically important change)  Routine Hem:  26-Mar-14 03:07   WBC (CBC) 6.6  RBC (CBC) 3.95  Hemoglobin (CBC) 12.0  Hematocrit (CBC) 35.3  Platelet Count (CBC) 259  MCV 89  MCH 30.4  MCHC 34.1  RDW 13.6  Neutrophil % 37.1  Lymphocyte % 50.8  Monocyte % 7.5  Eosinophil % 3.9  Basophil % 0.7  Neutrophil # 2.5  Lymphocyte # 3.4  Monocyte # 0.5  Eosinophil # 0.3  Basophil # 0.0 (Result(s) reported on 07 Aug 2012 at 03:47AM.)   Radiology Results: XRay:    27-Mar-14 12:50, KUB - Kidney Ureter Bladder  KUB - Kidney Ureter Bladder   REASON FOR EXAM:  ileus?  COMMENTS:       PROCEDURE: DXR - DXR KIDNEY URETER BLADDER  - Aug 08 2012 12:50PM     RESULT: Cholecystectomy clips are present. There is a nasogastric tube   present with the tip in the gastric fundus/body junction. Thereis no   abnormal bowel distention. Surgical anastomotic suture ring is seen in   the left lower pelvic region. Bony structures are unremarkable.    IMPRESSION:   1. No evidence of bowel obstruction or perforation.    Dictation Site: 2    Verified GC:YOYOOJZB H. BROWNE, M.D., MD   Assessment/Plan:  Assessment/Plan:  Assessment 1) change of bowel habits, clinically possible partial obstruction.  Of  note, diverticular surgery anastomosis site in the colon very tight to passage of scope on flex sig. some clinical improvement.   Plan 1) concern that the anastomotic stricure could , and likely will, present recurrent issues with difficult bowel habits and symptoms of partial left colonic obstruction.  Choices of treatment are colonoscopic dilation and surgical correction.  The latter is more likely a longer term improvement as often colonic dilitation is not satisfactory, and stenosis will quickly recur.  Continue current, following, will check 2 way film in am.   Electronic Signatures: Loistine Simas (MD)  (Signed 28-Mar-14 23:38)  Authored: Chief Complaint, VITAL SIGNS/ANCILLARY NOTES, Brief Assessment, Lab Results, Radiology Results, Assessment/Plan   Last Updated: 28-Mar-14 23:38 by Loistine Simas (MD)

## 2014-09-04 NOTE — H&P (Signed)
PATIENT NAME:  Julia Vega, STRONG MR#:  [id number] DATE OF BIRTH:  03/08/1966  DATE OF ADMISSION:  03/27/2013  PRIMARY CARE PROVIDER:  None. She just moved here. She is going to be looking for a physician.  ED REFERRING PHYSICIAN:  Dr. Corinna Capra.   CHIEF COMPLAINT:  Chest pain, back pain, shortness of breath.   HISTORY OF PRESENT ILLNESS:  The patient is a 49 year old white female with a history of nicotine addiction, a family history of coronary artery disease, has history of PTSD, panic attacks insomnia, anxiety, depression, who states that she was doing well up until a few days ago when she started having shortness of breath with any exertion. She has been feeling very weak and tired. Then she started having back pain in between her shoulder blades. Then today, started having some left-sided chest pain. The patient states that she has never had these type of symptoms before. She has not had any fevers, cough, no wheezing or denies any abdominal pain. She does have chronic nausea and vomiting. Denies any urinary symptoms.   PAST MEDICAL HISTORY:  Significant for:  1.  PTSD, a history of panic attacks.  2.  A history of insomnia.  3.  Anxiety.  4.  Depression.   PAST SURGICAL HISTORY: 1.  Status post colon resection in 2012 with colostomy with revision in 2013.  2.  Status post cholecystectomy.  3.  Status post kidney stones.  4.  Status post tonsillectomy.  5.  Status post appendectomy.  6.  Status post hysterectomy.   ALLERGIES:  None.   CURRENT MEDICATIONS:  She is just on promethazine 12.5 q.6 p.r.n.   FAMILY HISTORY:  Father had a heart attack at 51, mother also had a heart attack in her 74s. Multiple other family members with heart problems.   SOCIAL HISTORY:  Smokes about 1/2 pack per day since in her 17s. No alcohol or drug use.   REVIEW OF SYSTEMS: CONSTITUTIONAL:  Denies any fevers. Complains of fatigue, weakness, chest pain, back pain. No weight loss. No weight gain.   EYES:  No blurred or double vision. No pain. No redness. No inflammation. No glaucoma. No cataracts.  ENT:  No tinnitus. No ear pain. No hearing loss. No seasonal or year-round allergies. No epistaxis. No difficulty swallowing.  RESPIRATORY:  Denies any cough, wheezing, hemoptysis. Complains of dyspnea. No COPD.  CARDIOVASCULAR:  Complains of left-sided chest pain unrelated to activity. No orthopnea. No edema. No arrhythmias. Complains of dyspnea on exertion.  GASTROINTESTINAL:  No nausea, vomiting, diarrhea. No abdominal pain. No hematemesis. No melena.  GENITOURINARY:  Denies any dysuria, hematuria, or renal calculus or frequency.  ENDOCRINE:  Denies any polyuria or nocturia or thyroid problems.  HEMATOLOGIC AND LYMPHATIC:  Denies any major bruisability or bleeding.  SKIN:  No acne. No rash. No changes in hair, mole or skin.  MUSCULOSKELETAL:  Did complain of pain in the neck and back. No knee pain. No gout.  NEUROLOGIC:  No numbness. No CVA. No TIA.  PSYCHIATRIC:  Has anxiety, insomnia, ADD and depression.   PHYSICAL EXAMINATION:  VITAL SIGNS:  Temperature 98.4, pulse 87, respirations 18, blood pressure 132/85, O2 99%.  GENERAL:  The patient is a thin Caucasian female who appears in no acute distress.  HEENT:  Head atraumatic, normocephalic. Pupils equally round, reactive to light and accommodation. There is no conjunctival pallor or icterus.  NASAL EXAM:  Shows no drainage or ulceration.  OROPHARYNX:  Clear without any exudate.  NECK:  Supple without any JVDs.  CARDIOVASCULAR:  Regular rate and rhythm. No murmurs, rubs, clicks. PMI is not displaced.  LUNGS:  Clear to auscultation bilaterally without any rales, rhonchi, wheezing.  ABDOMEN:  Soft, nontender, nondistended. Positive bowel sounds x 4.  EXTREMITIES:  No clubbing, cyanosis or edema.  SKIN:  No rash.  LYMPHATICS:  No lymph nodes palpable.  VASCULAR:  Good DP, PT pulses.  PSYCHIATRIC:  Not anxious or depressed.  NEUROLOGIC:   Awake, alert, oriented x 3. No focal deficits.   EVALUATIONS IN THE EMERGENCY DEPARTMENT:  EKG shows incomplete right bundle branch block and no ST-T wave changes.   LABORATORY DATA:  Glucose 77, BUN 12, creatinine 0.87, sodium 139, potassium 3.6, chloride 111, CO2 is 23, calcium 9. LFTs are normal. Troponin less than 0.02. TUBS are positive for THC and tricyclics. WBC 6.3, hemoglobin 13.3, platelet count 301. Troponin less than 0.022.   CT angiogram of the chest shows no evidence of aortic dissection or aneurysm. No evidence of pulmonary emboli.   ASSESSMENT AND PLAN:  The patient is a 49 year old white female with a history of PTSD, panic disorder, insomnia, anxiety, depression, presents with dyspnea on exertion, chest pain, back pain.  1.  Chest pain, dyspnea on exertion. At this time, does have multiple risk factors including nicotine addiction. We are going to place the patient on observation. Obtain a Lexi maybe in the a.m. She states that she will not be able to walk too long on a treadmill because of her dyspnea on exertion. We will check a fasting lipid panel, start her on aspirin. We will use p.r.n. nitroglycerin.  2.  Anxiety, depression, not on any treatment. I recommended the patient followup outpatient with Psychiatry.  3.  Smoking cessation. The patient was counseled regarding smoking cessation and nicotine patch offered. She does not want to use a nicotine patch. Four minutes spent on this.    TIME SPENT:  45 minutes spent.   ____________________________ Lafonda Mosses. Posey Pronto, MD shp:jm D: 03/27/2013 15:28:26 ET T: 03/27/2013 15:47:05 ET JOB#: [id number] cc: Becky Berberian H. Posey Pronto, MD, <Dictator> Alric Seton MD ELECTRONICALLY SIGNED 03/28/2013 16:57

## 2014-09-04 NOTE — Consult Note (Signed)
Pt seen and examined. Please see [facility] notes. Distended abdomen with no BM or flatus x 2 days. Though no obvious obstructive pattern seen on x-ray, sxs similar to episode in 2012, when surgical resection done. Surgery consulted. Will follow their recommendations. Will try to give tap water enemas in AM in addition to fleets tonight and consider flex sigmoidoscopy tomorrow to evaluate left side of colon for infectious colitis, diverticulitis, or ischemic colitis. Thanks.  Electronic Signatures: Verdie Shire (MD)  (Signed on 26-Mar-14 17:51)  Authored  Last Updated: 26-Mar-14 17:51 by Verdie Shire (MD)

## 2014-09-04 NOTE — Discharge Summary (Signed)
PATIENT NAME:  Julia Vega, Julia Vega MR#:  [id number] DATE OF BIRTH:  22-Aug-1965  DATE OF ADMISSION:  08/06/2012 DATE OF DISCHARGE:  08/11/2012  DISCHARGE DIAGNOSIS:  1. Abdominal pain secondary to acute partial colonic obstruction at the prior anastomotic site from previous sigmoid colectomy.  2. Depression.  DISCHARGE MEDICATIONS:  1. Colace 100 mg p.o. daily.  2. Dicyclomine 10 mg every 8 hours as needed for abdominal cramps.  3. Polyethylene glycol 17 grams daily for constipation.  4. Percocet 5/325 one tablet p.o. t.i.d. as needed for pain. 5. Seroquel 25 mg p.o. 4 times daily, 100 mg at bedtime.  DIET: Low residue, low fiber diet.   FOLLOWUP: Follow up with Dr. Leanora Cover and also Dr. Verdie Shire. 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   HOSPITAL COURSE: The patient is a 49 year old female patient with history of severe diverticulitis in 2012 followed by colonic obstruction and ischemia, requiring surgical intervention and removal of 1 foot of her colon at that time and follows up with gastroenterologist at [facility], Dr. Shana Chute. The patient admitted because of abdominal pain and also some nausea and blood from the stool. Refer to the history and physical for full details. The patient was seen in the ER the day before. CT of the abdomen and pelvis showed colitis. She was sent home with Cipro and Flagyl, and because of persistent abdominal pain, nausea and  blood in the stool, she came to the ER and was admitted for colitis. The patient was made n.p.o., seen by gastroenterologist, Dr. Verdie Shire, and also surgeon Dr. Leanora Cover. The patient received some Fleet's enemas because of constipation. She has a history of Hartmann procedure in 2012 in [place], which was reversed in 2013. The patient's x-ray did not show an obstruction, but she did have abdominal distention, CT scan showing some colitis, and so she was started on n.p.o., fluids along with  antibiotics, meropenem. The patient was seen by Dr. Leanora Cover, who recommended NG tube because of abdominal distention, and she was taken for a flexible sigmoidoscopy on the 27th, and flexible sigmoidoscopy showed anastomotic stricture and no evidence of colitis. 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 The patient's abdominal pain resolved, and then she was started on> clear liquids. then advanced to  low fiber and low residue diet. GI has been following the patient and also surgery as well. The patient likely to benefit from revison surgery for stricture. The patient gradually improved in terms of pain, tolerated the diet. Her abdominal x-rays were followed daily, and on the 27th, did not show any obstruction or perforation, and Dr. Leanora Cover has said the patient can follow up with them as an outpatient. Dr. Leanora Cover said the patient can continue MiraLax and Bentyl for abdominal cramps, and the patient needs to follow up with them in 2 to 4 weeks for followup on the anastomotic stricture, and the patient advised to make appointment with him to discuss the treatment options. The patient was given a prescription for Bentyl along with MiraLax. She was supposed to see Dr. Weber Cooks, but when Dr. Weber Cooks came, the patient was vomiting, and she was tired that day. He advised to continue the Seroquel for her depression if she can tolerate by p.o. The patient was sent home with Seroquel. At the time of discharge, the patient felt fine, tolerated the diet and really wanted to go home, so we discharged her on  the 30th of March.   Time spent on discharge preparation, reviewing charts took more than 30 minutes.   ____________________________ Epifanio Lesches, MD sk:OSi D: 08/20/2012 22:33:19 ET T: 08/21/2012 05:45:26 ET JOB#: [id number]  cc: Epifanio Lesches, MD, <Dictator> Epifanio Lesches MD ELECTRONICALLY SIGNED 08/27/2012 16:45

## 2014-09-04 NOTE — Consult Note (Signed)
PATIENT NAME:  Julia Vega, Julia Vega MR#:  [id number] DATE OF BIRTH:  05-Jan-1966  DATE OF CONSULTATION:  08/07/2012  REFERRING PHYSICIAN:  Theodoro Grist, MD CONSULTING PHYSICIAN: Verdie Shire, MD / Corky Sox. Heriberto Stmartin, PA-C  REASON FOR CONSULTATION: Colitis and rectal bleeding.   HISTORY OF PRESENT ILLNESS: This is a pleasant 49 year old female with a past medical history significant for severe diverticulitis in 2012 with accompanying bowel obstruction and ischemia requiring surgical intervention and removal of 1 foot of her colon. During that time she did have a colostomy and subsequent reversal. She presented to the hospital with concerns of abdominal pain and some blood in her stool that began a few days prior to admission.  She did have only 1 episode of blood in her stool that she states was bright red and this has since resolved. Since being admitted she states that she has not passed any stool or flatus. This has now been 3 days of obstipation. She does state that her abdomen does seem to be growing in girth and is becoming quite uncomfortable as it gets distended. There is some associated nausea; however, she denies any vomiting. She denies any heartburn. She is tolerating a clear liquid diet; however, she does feel as though it can make the pain worse. She has been afebrile. No chest pain or shortness of breath. No trouble swallowing. Further work-up did include a CT of the abdomen and pelvis that was notable for left-sided colitis extending down into the sigmoid and rectum. There was no clear evidence of an obstruction or free air and sutures were noted from a prior bowel surgery, in the rectosigmoid region. Because of her increasing distention and no improvement since admission with antibiotics, and  x-ray, 3-way of the abdomen, was obtained last night that did show a nonobstructive bowel gas pattern. When discussing with the patient about her history of diverticulitis requiring surgery, she does feel as though  this current abdominal pain feels extremely similar to the way that it felt back then. She does state that the obstruction was not identified until she was on the operating table. Of note, lipase and liver enzymes were unremarkable and troponins have been negative. She does not currently have a white count and her hemoglobin is stable. She is afebrile and vital signs have remained stable throughout admission. She has been placed on antibiotics since admission, initially meropenem, and now additionally Cipro. Stool studies have been ordered, but have been unable to be obtained as the patient has not been able to provide [place] with a stool sample. Of note, she most recently had a sigmoidoscopy in November of 2013 at [facility] by Dr. Ivin Booty.  During this time she was having some abdominal discomfort and bleeding at the time and she was found to have colitis. This improved with antibiotics and she has been doing well up until this past Friday when symptoms returned. Her prior bowel surgery was in the fall of 2012.   ALLERGIES: No known drug allergies.   PAST MEDICAL HISTORY: As mentioned above, she did have a history of bowel obstruction from diverticulitis causing ischemic bowel and had a colostomy in 2012. There was subsequent reversal in June of 2013. She also has recurrent major depressive disorder with history of suicidal ideation, anxiety, PTSD/bipolar, dyslipidemia and diverticulosis.   PAST SURGICAL HISTORY: Colostomy in 2012, takedown in June of 2013, cholecystectomy in June 2013, tonsillectomy, appendectomy and partial hysterectomy. Also nephrolithiasis.   HOME MEDICATIONS: The patient does not take any medications  on a regular basis. However, she was recently put on Cipro and Flagyl beginning earlier this week.   FAMILY HISTORY: The patient does report multiple cancers in her family including bone, breast, liver and lung. However, she denies any known family history of colon cancer, specifically. No  known family history of IBD.   SOCIAL HISTORY: Current daily tobacco use with a 10 pack-year history. Negative alcohol use. Negative illicit drug use.   REVIEW OF SYSTEMS: Ten system review was obtained on the patient. The patient does also report feeling some chills with associated increased fatigue. All other pertinent positives are mentioned above and are otherwise negative.   PHYSICAL EXAMINATION:  VITAL SIGNS: Blood pressure 104/57, heart rate 64, respirations 20, temp 97.8.  GENERAL: This is a pleasant 49 year old female resting quietly and comfortably in bed in no acute distress, alert and oriented x 3.  HEAD: Atraumatic, normocephalic.  NECK: Supple. No lymphadenopathy noted.  ENT: Sclerae anicteric. Mucous membranes moist.  LUNGS:  Respirations are even and unlabored. Clear to auscultation in bilateral anterior lung fields.  HEART: Regular rate and rhythm. S1 and S2 noted.  ABDOMEN: Soft. Notable for distention. The patient is not tender to palpation and there is no evidence of acute abdomen. She states that her abdominal pain is the same regardless of palpation or not. Bowel sounds are noted in all 4 quadrants. No organomegaly, hernias or masses appreciated; however, exam is limited secondary to significant distention.  RECTAL: Deferred.  EXTREMITIES: Negative for lower extremity edema. 2+ pulses noted in bilateral upper extremities.   PSYCH:  Appropriate mood and affect. NEUROLOGIC:  Cranial nerves II through XII grossly intact. Alert and oriented x 3.   IMAGING: A CT of the abdomen and pelvis was obtained on the patient showing colitis of the left colon extending down to the sigmoid and rectum. No evidence of obstruction or free air. There are sutures from her prior bowel surgery noted in the rectosigmoid region.   A 3-way x-ray of the abdomen was obtained on the patient that was negative. There is a nonobstructive bowel gas pattern.   LABORATORY DATA: White blood cells 6.6,  hemoglobin 12.0, hematocrit 35.3, platelets 259, and MCV 89.  Sodium 141, potassium 3.6, BUN 11, creatinine 0.82, glucose 92, bilirubin 0.5, alk phos 97, ALT 36, AST 42 and albumin 3.7. Troponins have been negative. Lipase 306.   ASSESSMENT: 1.  Colitis noted on CT scan involving the left colon, sigmoid and rectum.  2.  Abdominal pain with significant distention.  3.  Obstipation, the patient has not passed stool or flatness for 3 days.   PLAN: I have discussed this patient's case in detail with Dr. Verdie Shire who is involved in the development of the patient's plan of care. At this time, we did review the CT and the x-ray. Notable for colitis of the left colon and nonobstructive bowel gas pattern; however, the patient has not been able to pass stool or flatus for 3 days and her abdomen is quite distended. With a history of significant diverticulitis causing ischemia of the bowel requiring surgery, we do recommend obtaining a surgical consult as the patient does state that this sensation feels very similar to the way that it did back then. Additionally, we do agree with the patient being maintained on antibiotics and continuing symptomatic management with pain control and antiemetics as needed. We will also order a Fleet enema to see if this is able to produce stool and will tentatively schedule the  patient for flexible sigmoidoscopy tomorrow with Dr. Candace Cruise pending the surgical evaluation. The above plan of care was discussed in detail with the patient who verbalized understanding and is in agreement. All questions were answered. We will continue to monitor this patient throughout hospitalization and make further recommendations pending above and per clinical course.   Thank you so much for this consultation and for allowing [place] to participate in the patient's plan of care.   This was discussed and agreed upon under supervisory agreement between myself and Dr. Verdie Shire.  ____________________________ Corky Sox.  Kallyn Demarcus, PA-C kme:sb D: 08/07/2012 13:10:09 ET T: 08/07/2012 13:36:23 ET JOB#: [id number]  cc: Corky Sox. Kortez Murtagh, PA-C, <Dictator> [place] PA ELECTRONICALLY SIGNED 08/07/2012 17:25

## 2014-09-04 NOTE — Consult Note (Signed)
Chief Complaint:  Subjective/Chief Complaint continues with abdominal apin 5/10 </>.  tolerating clears po.  mild nausea, no vomiting.  passing flatus but no stool   VITAL SIGNS/ANCILLARY NOTES: **Vital Signs.:   29-Mar-14 13:30  Vital Signs Type Routine  Temperature Temperature (F) 97.4  Celsius 36.3  Temperature Source oral  Pulse Pulse 77  Respirations Respirations 18  Systolic BP Systolic BP 923  Diastolic BP (mmHg) Diastolic BP (mmHg) 77  Mean BP 92  Pulse Ox % Pulse Ox % 98  Pulse Ox Activity Level  At rest  Oxygen Delivery Room Air/ 21 %   Brief Assessment:  Cardiac Regular   Respiratory clear BS   Gastrointestinal details normal Soft  mild distension, protuberance, mild generalized tenderness to palpation.   Lab Results: Routine Chem:  29-Mar-14 06:01   Glucose, Serum 99  BUN  6  Creatinine (comp) 0.79  Sodium, Serum 140  Potassium, Serum 3.7  Chloride, Serum  109  CO2, Serum 27  Calcium (Total), Serum 8.7  Anion Gap  4  Osmolality (calc) 277  eGFR (African American) >60  eGFR (Non-African American) >60 (eGFR values <32m/min/1.73 m2 may be an indication of chronic kidney disease (CKD). Calculated eGFR is useful in patients with stable renal function. The eGFR calculation will not be reliable in acutely ill patients when serum creatinine is changing rapidly. It is not useful in  patients on dialysis. The eGFR calculation may not be applicable to patients at the low and high extremes of body sizes, pregnant women, and vegetarians.)   Radiology Results: XRay:    29-Mar-14 07:23, Abdomen Flat and Erect  Abdomen Flat and Erect   REASON FOR EXAM:    partial colonic obstruction. please compare with   previous film.  COMMENTS:       PROCEDURE: DXR - DXR ABDOMEN 2 V FLAT AND ERECT  - Aug 10 2012  7:23AM     RESULT: Comparison is made to prior study dated 08/07/2012 and 08/08/2012.    Air is seen within nondilated loops of large and small bowel. The    visualized bony skeleton is unremarkable.    The patient's NG tube has been removed in the interim.     IMPRESSION:  1. Nonobstructive bowel gas pattern. Findings are similar to previous   study dated 08/08/2012.     Thank you for the opportunity to contribute to the care of your patient.           Verified By: HMikki Santee M.D., MD   Assessment/Plan:  Assessment/Plan:  Assessment 1) abdominal pain secondary to intermittant partial colonic obstruction at the prior anastomotic site from previous sigmoid colectomy. marked narrowness noted on flex sig.   Plan 1) will trial prn bentyl and daily miralax to keep stools softer.  Will likley need to have revision of anastomic stricture at some point, doubt multiple colonoscopic dilitations will be effective long term. .   Electronic Signatures: SLoistine Simas(MD)  (Signed 29-Mar-14 13:43)  Authored: Chief Complaint, VITAL SIGNS/ANCILLARY NOTES, Brief Assessment, Lab Results, Radiology Results, Assessment/Plan   Last Updated: 29-Mar-14 13:43 by SLoistine Simas(MD)

## 2014-09-04 NOTE — Consult Note (Signed)
Brief Consult Note: Diagnosis: clinical bowel obstruction.   Patient was seen by consultant.   Consult note dictated.   Orders entered.   Comments: s/p Hartmann's 2012 and ostomy takedown 2013. No bowel obstruction by xray, but abdomen distended (without much tympany or tenderness). CT shows chronic confluent mild colon wall thickening from splenic flexure to anastomosis, but also solid stool in ascending colon and distal transverse colon. I have ordered a NG tube and repeat XRays. Will follow. Rectal bleeding cause unclear.  Electronic Signatures: Consuela Mimes (MD)  (Signed 26-Mar-14 17:33)  Authored: Brief Consult Note   Last Updated: 26-Mar-14 17:33 by Consuela Mimes (MD)

## 2014-09-04 NOTE — Consult Note (Signed)
Brief Consult Note: Comments: Psychiatry: Consult received this morning. I came by just now to see patient but she has already left. Sorry I was not able to be of help.  Electronic Signatures: Gonzella Lex (MD)  (Signed [phone] 18:36)  Authored: Brief Consult Note   Last Updated: 14-Nov-14 18:36 by Gonzella Lex (MD)

## 2014-09-04 NOTE — H&P (Signed)
PATIENT NAME:  Julia Vega, Julia Vega MR#:  [id number] DATE OF BIRTH:  31-Jan-1966  DATE OF ADMISSION:  08/06/2012  PRIMARY CARE PHYSICIAN:  None.  Please note that there is no primary care physician.   HISTORY OF PRESENT ILLNESS:  The patient is a 49 year old Caucasian female with past medical history significant for history of complicated abdominal problems such as diverticulitis, status post bowel obstruction in the past, history of colostomy in November 2012 and reversal of colostomy in June 2013 presents to the hospital with complaints of abdominal swelling as well as abdominal pain.  According to patient she has been having abdominal pain for a while now, chronic abdominal pains, however since Friday, approximately 4 days ago, she started having severe abdominal discomfort.  She describes this cramping, intermittent pain, increasing whenever she eats.  She also is complaining of constipation.  Her last bowel movement however was diarrheal.  Her last bowel movement had been on Sunday, two days ago.  She also noted bright red blood in her stool three days ago, but that was loose stools.  It was one episode of bleeding only.  She had however some bleeding in November 2013.  At that time she had a sigmoidoscopy in October 2013 after which she was diagnosed with colitis by Dr. Ivin Booty, gastroenterologist from [facility].  She was supposed to return back to Dr. Debby Freiberg office, however did not go back because she felt somewhat better.  Now she moved to her daughter's house from [facility] and she does not have any primary care physician and presents to the hospital to the Emergency Room for the second time in the past few days with complaints.  She was here on 08/04/2012.  She had CT scan of her abdomen done with contrast which revealed colitis.  Since her pain is not improving now after patient was given antibiotics at home,  hydrocodone for pain as well as ciprofloxacin as well as Flagyl, hospitalist services were  contacted for admission.   PAST MEDICAL HISTORY:  Significant for a history of diverticulitis in the past, history of bowel obstruction due to the diverticulitis, colostomy in November 2012, taken down in June 2013, history of gallbladder surgery in June 2013, history of recurrent major depression disorder, severe, suicidal ideation in September 2011 as well as 2007, suicide attempt in 2007, history of cannabis abuse, history of anxiety disorder, diagnosis of PTSD versus bipolar disorder in the past, history of reflex sympathetic dystrophy in the right forearm, hysterectomy, appendectomy, cyst in the right hand, tonsillectomy in 1979, hyperlipidemia.   MEDICATIONS:  None except Cipro as well as Flagyl which was given for her since 2 days ago by Emergency Room physician.  Also, hydrocodone.   ALLERGIES:  None.  FAMILY HISTORY:  The patient's mother had a stroke at the age of 45.  The patient's father had an MI at the age of 64.  The patient's aunts as well as uncles have diabetes mellitus, multiple cancers, breast, bone, liver, lung, in multiple family members.   SOCIAL HISTORY:  The patient smokes for the past 10 years at least.  She smokes now 3 cigarettes a day.  According to patient she does not smoke much and never smoked, however.   She is separated.  She has four children.  She lives with her daughter.  Denies any alcohol abuse.  She is on disability now.   REVIEW OF SYSTEMS:   Positive for feeling feverish and chilly for the past one week.  She is  fatigued and weak.  She has had abdominal pains as well as abdominal distention as well as weight loss of about 50 pounds over the past 6 to 8 months because of recurrent abdominal pains.  She says that she is not able to eat multiple meals including hamburgers.  She admits of having some runny nose, admits to some cough as well as wheezing intermittently with clear sputum production, shortness of breath intermittently whenever she exerts herself, some  chest pains on and off for the past one year in the midsternal area, never talked to a physician about this in the past as well as back pain and nausea and vomiting over the past 4 days as well as the abdominal pains which seem to be intermittent as well as rectal bleeding 3 days ago.  Left knee pain intermittently for which she uses Tylenol.   CONSTITUTIONAL:  Denies any high fevers or weight gain. EYES:  No blurry vision, double vision, glaucoma or cataracts.  EARS, NOSE, THROAT:  Denies any tinnitus, allergies, epistaxis, sinus pain, dentures or difficulty swallowing.  RESPIRATORY:  Denies any hemoptysis, asthma, COPD.  CARDIOVASCULAR:  Denies orthopnea, edema, palpitations or syncopal episode.   GASTROINTESTINAL:  Denies any hematemesis.  Admits to rectal bleeding.  Denies any jaundice, change in bowel habits.  GENITOURINARY:  Denies any dysuria, hematemesis, frequency, incontinence.  ENDOCRINOLOGY:  Denies any polydipsia, nocturia, thyroid problems, heat or cold intolerance, or thirst.  HEMATOLOGIC:  Denies anemia, easy bruising, bleeding, enlarged lymphnodes.  SKIN:  Denies any acne, rashes or change in moles.  MUSCULOSKELETAL:  Denies arthritis, cramps, swelling.  NEUROLOGIC:  No numbness, epilepsy, tremor.   PSYCHIATRIC:  Denies anxiety, insomnia or depression.    PHYSICAL EXAMINATION: VITAL SIGNS:  On arrival to the hospital, temperature 97.9, pulse 91, respiration rate was 18, blood pressure 128/83, saturation 98% on room air. GENERAL:  This is a well-developed, well-nourished Caucasian female in no significant distress, moderately uncomfortable secondary to the abdominal pain.  HEENT:  Her pupils are equal, reactive to light.  Extraocular movements intact.  No icterus or conjunctivitis.  Has normal hearing.  No pharyngeal erythema.  Mucosa is moist.  NECK:  No masses, supple, nontender.  Thyroid is not enlarged.  No adenopathy.  No JVD or carotid bruits,  full range of motion.  LUNGS:   Clear to auscultation in all fields.  No rales, diminished breath sounds or wheezing.  No labored inspirations, increased effort, dullness to percussion, not in overt respiratory distress.  CARDIOVASCULAR:  S1, S2 appreciated.  No murmurs, gallops or rubs.  Rhythm is regular.  PMI not lateralized.  Chest is nontender to palpation. 2 plus pedal pulses.  No lower extremity edema, calf tenderness or cyanosis is noted.  ABDOMEN:  Protuberant, moderately firm.  No hepatosplenomegaly or masses were noted.  No rebound was noted.  The patient does have guarding all over her abdomen, but no one area of particular tenderness was elicited.  She seemed to be more uncomfortable however in the left lower quadrant, but no rebound or guarding was noted in that area.  RECTAL:  Deferred.  MUSCULOSKELETAL:  Able to move all extremities.  No cyanosis, degenerative joint disease or kyphosis.  Gait is not tested.  SKIN:  Did not reveal any rashes, lesions, erythema or induration.  It was warm and dry to palpation.  LYMPHATIC:  No adenopathy in the cervical region.  NEUROLOGICAL:  Cranial nerves was intact.  Sensory is intact.  No dysarthria or aphagia.  The patient is alert and oriented to time, person, place, cooperative.  Memory is somewhat impaired, but no significant confusion, agitation or depression noted.   LABORATORY DATA:  BMP done on 08/06/2012 showed the glucose of 103, otherwise BMP was unremarkable.  Lipase level was normal at 306.  The patient's liver enzymes revealed mild elevation of total protein of 8.4, otherwise unremarkable.  CBC within normal limits with white blood cell count 8.7, hemoglobin 14.4, platelet count 309.  CBC was done on 08/04/2012, showed normal CBC with no left shift.  Urinalysis, yellow clear urine, negative for glucose, bilirubin or ketones, specific gravity 1.014, pH was 5.0 with 2+ blood.  Negative for protein and nitrites, trace leukocytes esterase, 1 red blood cell, 1 white blood cell,  no bacteria, less than 1 epithelial cell and mucus was present.    EKG was not done during this admission, however it was done on 08/04/2012.  At that time, the patient was noted to have a normal sinus rhythm at 78 beats per minute, normal axis, left anterior fascicular block, nonspecific ST-T wave changes were noted.   RADIOLOGIC STUDIES:  CT scan of abdomen and pelvis with contrast 08/04/2012 showed colitis.  No bowel obstruction or free air was noted.  Atelectasis at lung bases was also noted.  Mild chronic wall thickening along left colon, sigmoid colon as well as rectum was noted.  X-ray done today of patient's abdomen including PA chest was unremarkable.   ASSESSMENT AND PLAN: 1.  Colitis of unclear etiology at this time.  We will get stool cultures for infection.  We will continue patient on meropenem at this time.  We will get gastroenterologist involved for further recommendations.  We will get medical records from gastroenterologist at [facility], Dr. Ivin Booty.   2.  Gastrointestinal bleed.  We will ask gastroenterologist to see patient for sigmoidoscopy versus colonoscopy.  3.  Dehydration.  We will continue IV fluids.  4.  Hyperglycemia.  Get hemoglobin A1c.  5. History of depression.  Resume patient's depression medications which were given in September 2011.  At that time, the patient was discharged on Seroquel at bedtime.  We will resume Seroquel as needed.  We will ask psychiatrist to see patient while in the hospital again.   TIME SPENT:  50 minutes.    ____________________________ Theodoro Grist, MD rv:ea D: 08/06/2012 23:08:33 ET T: 08/07/2012 00:32:21 ET JOB#: [id number]  cc: Theodoro Grist, MD, <Dictator> Chonita Gadea MD ELECTRONICALLY SIGNED 08/15/2012 15:41

## 2014-09-04 NOTE — H&P (Signed)
  Alric Seton MD ELECTRONICALLY SIGNED 03/28/2013 16:57

## 2014-09-04 NOTE — Discharge Summary (Signed)
PATIENT NAME:  Julia Vega, Julia Vega MR#:  [id number] DATE OF BIRTH:  Jan 12, 1966  DATE OF ADMISSION:  03/27/2013 DATE OF DISCHARGE:  03/28/2013  CHIEF COMPLAINT: Chest pain.   DISCHARGE DIAGNOSES: 1.  Chest pain, likely not cardiac with low risk stress test.  2.  History of panic attacks and posttraumatic stress disorder.  3.  History of insomnia.  4.  Anxiety.  5.  History of depression.   DISCHARGE MEDICATIONS: Promethazine 12.5 mg every 6 hours as needed 1 tab for nausea and vomiting.   DIET: Low sodium.   ACTIVITY: As tolerated.   Please follow with your doctor in 1 to 2 weeks.   DISPOSITION: Home.   SIGNIFICANT LABS AND IMAGING:  The stress test read as low risk. CT angio of the chest done to rule out dissection was negative for dissection, aneurysm or PE. No acute findings in the chest. Troponins were negative. LFTs within normal limits. TSH 3.35. U-tox positive for tricyclic antidepressants and cannabinoids. D-dimer less than 0.22.  HISTORY OF PRESENT ILLNESS AND HOSPITAL COURSE:  For full details of the H and P, please see the dictation on the day of admission by Dr. Posey Pronto, but briefly this is a 49 year old female with depression, panic attacks, PTSD, who came in for above chief complaint. She has been having some shortness of breath with exertion per her; feeling weak and tired and had some chest pain. There was also some back pain between her shoulder blades. She was admitted to the hospitalist service for possible acute coronary syndrome.  While in the ER, she had a d-dimer drawn, which was within normal limits, and she also underwent CT angio of the chest, given her symptoms of chest pain and back pain. The CT angio of the chest was negative for aneurysm or dissection and did not show any evidence for PE. She was admitted to the hospitalist service for observation. Her troponins were negative x 3, and she has been essentially ruled out for acute coronary syndrome. She has had no further  chest pains. She, of note, did have some burning sensation in her throat, and I believe there is an element of reflux, but that is infrequent. The stress test was low risk, and at this point, she has no further chest symptoms and will be discharged with outpatient followup. In regards to her depression, anxiety and PTSD, although initially a psych consult was ordered, the patient stated that she feels much better and this is the best she has been in a while, and she is in "a better place."  She can follow psych as an outpatient.   PHYSICAL EXAMINATION: VITAL SIGNS: On the day of discharge,  her temperature 98.3, pulse rate 76, respiratory rate 20, blood pressure 109/76, O2 sat 97%.  GENERAL: The patient is a well-developed female, lying in bed, no obvious distress, talking in full sentences.  HEENT:  Normocephalic, atraumatic. Pupils are equal.  NECK: Supple.  CARDIOVASCULAR: S1, S2. Irregularly irregular.  LUNGS AND CHEST: Clear to auscultation. There is no point tenderness on auscultation of the spine nor tenderness to palpation on anterior chest.  ABDOMEN: Soft, nontender, nondistended.  EXTREMITIES: Show no significant lower extremity edema.   She will be discharged with outpatient followup. She was encouraged to follow with a physician as she has none.   Total time spent was 32 minutes.   ____________________________ Vivien Presto, MD sa:dmm D: 03/28/2013 16:35:42 ET T: 03/28/2013 22:38:51 ET JOB#: [id number]  cc: Vivien Presto, MD, <Dictator> [facility]  MD ELECTRONICALLY SIGNED 04/08/2013 15:26

## 2014-09-04 NOTE — Consult Note (Signed)
Brief Consult Note: Diagnosis: colitis.   Consult note dictated.   Discussed with Attending MD.   Comments: Pt seen and examined. abdominal exam notable for abdominal distention; +bowel sounds. no signs of acute abdomen. c/o worsening pain and obstipation. -Recc surgical consult to eval colitis, hx of diverticulitis with obstruction and ischemia requiring 1 foot of colon removed. She states this feels similar to the way she felt in 2012 when this occured. Will order fleet enema as pt has not been passing stool or flatus x3 days. CT and xray reviewed. Will schedule flex sig for tomorrow unless surgical note instructs otherwise. full consult dictated.  Electronic Signatures: Sherald Barge (PA-C)  (Signed 26-Mar-14 12:55)  Authored: Brief Consult Note   Last Updated: 26-Mar-14 12:55 by Sherald Barge (PA-C)

## 2014-09-04 NOTE — Consult Note (Signed)
PATIENT NAME:  Julia Vega, Julia Vega MR#:  [id number] DATE OF BIRTH:  January 15, 1966  DATE OF CONSULTATION:  08/07/2012  CONSULTING PHYSICIAN:  Consuela Mimes, MD  HISTORY OF PRESENT ILLNESS:  [name] is a 49 year old white female who has been experiencing abdominal pain, intermittent nausea and vomiting and abdominal distention for the last 5 days. Three days ago she had her last bowel movement and it was loose in consistency and associated with blood that dripped into the toilet. The last time she vomited was last night, but she says that she is currently nauseous. Her abdominal pain is generalized and is crampy and intermittent. She denies constant abdominal pain, hematemesis, fever.   PAST MEDICAL HISTORY: 1.  Reflex sympathetic dystrophy of the right hand.  2.  Degenerative disk disease.  3. PTSD verses bipolar disorder with previous history of suicidal ideation (2007 and 2011). 4.  Dyslipidemia.  5 Status post cholecystectomy June 2013, status post Hartmann's procedure at [facility] November 2012, status post colostomy takedown at [facility] June 2013.  6.  History of anxiety and major depression.  7.  Status post hysterectomy.  8.  Status post appendectomy.  9. Dyslipidemia.  MEDICATIONS AT THE TIME OF ADMISSION:  None (other than the Cipro, Flagyl and hydrocodone that she received 2 days prior to admission the Emergency Department).   ALLERGIES:  None.   FAMILY HISTORY:  Noncontributory.   SOCIAL HISTORY: The patient is separated. She recently moved from [place] to [place], [place] to live with one of her daughters. She has 4 children. She is unemployed. She is on disability for her RSD and her DDD. She smokes 3 cigarettes per day and does not drink alcohol. She does use cannabis.   REVIEW OF SYSTEMS: Difficult to ascertain as the patient basically has a positive review of systems with multiple complaints including neurologic, HEENT,  pulmonary, cardiac, musculoskeletal systems. Specifically, regarding her gastrointestinal system, she says that her abdominal distention has not worsened since hospital admission but it did increase from basically flat abdomen to her current protuberant level prior to admission.   PHYSICAL EXAMINATION: GENERAL: Reveals a comfortable, middle-aged, white female. Height 5 feet 6 inches, weight 159 pounds, BMI 25.7. VITAL SIGNS:  Temperature maximum since hospital admission is 98, pulse 72, respirations 20, blood pressure 95/58, oxygen saturation 96% to 99% at rest on room air.  HEENT: Pupils are equally round and reactive to light. Extraocular movements intact. Sclerae nonicteric. Oropharynx clear. Mucous membranes moist. Hearing intact to voice.  NECK: Supple without tracheal deviation or jugular venous distention.  HEART: Regular rate and rhythm with no murmurs or rubs.  LUNGS: Clear to auscultation with normal respiratory effort bilaterally.  ABDOMEN: Protuberant and/or distended with a well-healed midline scar and old left lower quadrant colostomy site scar. The patient has some tympany but not a lot. Although she has some mild tenderness, there is no evidence of rebound tenderness or involuntary guarding.  EXTREMITIES: No edema with normal capillary refill bilaterally.  NEUROLOGIC: Cranial nerves II through XII, motor and sensation grossly intact.  PSYCHIATRIC: Alert and oriented x 4, appropriate affect.  OTHER STUDIES:  Laboratory values available from March 23, March 25 and today (March 26) show normal CBC x 3. Normal coags. Normal urinalysis x 2. Normal electrolytes x 3. Normal hepatic profile x 3. Normal lipase x 2. Normal hemoglobin A1c.  Abdominal 3-way film from March 23 and again on March 25 are essentially normal, showing a gas pattern  and a nonobstructing gas pattern.  CT scan of the abdomen and pelvis with contrast on March 23, which I reviewed myself, reveals staple line in the  rectosigmoid region with mild chronic wall thickening along the entire left colon. There is solid stool in the ascending colon and the distal transverse colon along with air. There is no free air and there is no evidence of small or large bowel obstruction.   ASSESSMENT: Obstipation and constipation in the setting of postoperative Hartmann's procedure and subsequent colostomy takedown with mild chronic colon wall thickening of the descending colon and sigmoid colon. Although there is no obstructive pattern on the x-ray, the patient does have significant abdominal protuberance and/or distention and has a history consistent with possible bowel obstruction. If, in fact, she has a bowel obstruction, she has no evidence for acute surgical intervention in that she is afebrile, has a normal white blood cell count and has character of pain that is intermittent and crampy, rather than constant. The history of bright red blood per rectum and diarrhea with her last stool is concerning also.   PLAN: We will place a nasogastric tube this evening to low continuous suction and obtain another x-ray following that, both for nasogastric tube placement, as well as to rule out a progressive bowel obstruction. We will continue to follow her with you.    ____________________________ Consuela Mimes, MD wfm:ce D: 08/07/2012 17:30:18 ET T: 08/07/2012 17:58:17 ET JOB#: [id number]  cc: Consuela Mimes, MD, <Dictator> Consuela Mimes MD ELECTRONICALLY SIGNED 08/11/2012 10:58

## 2014-09-14 DIAGNOSIS — Z0000 Encounter for general adult medical examination without abnormal findings: Secondary | ICD-10-CM | POA: Diagnosis not present

## 2014-09-14 DIAGNOSIS — Z87898 Personal history of other specified conditions: Secondary | ICD-10-CM | POA: Diagnosis not present

## 2014-09-14 DIAGNOSIS — D229 Melanocytic nevi, unspecified: Secondary | ICD-10-CM | POA: Diagnosis not present

## 2014-09-14 DIAGNOSIS — R8299 Other abnormal findings in urine: Secondary | ICD-10-CM | POA: Diagnosis not present

## 2014-09-14 DIAGNOSIS — E663 Overweight: Secondary | ICD-10-CM | POA: Diagnosis not present

## 2014-09-24 DIAGNOSIS — Z87898 Personal history of other specified conditions: Secondary | ICD-10-CM | POA: Diagnosis not present

## 2014-10-07 DIAGNOSIS — R140 Abdominal distension (gaseous): Secondary | ICD-10-CM | POA: Diagnosis not present

## 2014-10-07 DIAGNOSIS — R109 Unspecified abdominal pain: Secondary | ICD-10-CM | POA: Diagnosis not present

## 2014-10-07 DIAGNOSIS — Z9049 Acquired absence of other specified parts of digestive tract: Secondary | ICD-10-CM | POA: Diagnosis not present

## 2014-10-07 DIAGNOSIS — R160 Hepatomegaly, not elsewhere classified: Secondary | ICD-10-CM | POA: Diagnosis not present

## 2014-10-07 DIAGNOSIS — R1084 Generalized abdominal pain: Secondary | ICD-10-CM | POA: Diagnosis not present

## 2014-10-07 DIAGNOSIS — F17210 Nicotine dependence, cigarettes, uncomplicated: Secondary | ICD-10-CM | POA: Diagnosis not present

## 2014-10-07 DIAGNOSIS — R21 Rash and other nonspecific skin eruption: Secondary | ICD-10-CM | POA: Diagnosis not present

## 2014-10-07 DIAGNOSIS — I4519 Other right bundle-branch block: Secondary | ICD-10-CM | POA: Diagnosis not present

## 2014-10-07 DIAGNOSIS — B182 Chronic viral hepatitis C: Secondary | ICD-10-CM | POA: Diagnosis not present

## 2014-10-07 DIAGNOSIS — Z888 Allergy status to other drugs, medicaments and biological substances status: Secondary | ICD-10-CM | POA: Diagnosis not present

## 2014-10-07 DIAGNOSIS — E785 Hyperlipidemia, unspecified: Secondary | ICD-10-CM | POA: Diagnosis not present

## 2014-10-08 DIAGNOSIS — I4519 Other right bundle-branch block: Secondary | ICD-10-CM | POA: Diagnosis not present

## 2014-10-29 DIAGNOSIS — K5900 Constipation, unspecified: Secondary | ICD-10-CM | POA: Diagnosis not present

## 2014-10-29 DIAGNOSIS — B182 Chronic viral hepatitis C: Secondary | ICD-10-CM | POA: Diagnosis not present

## 2014-12-13 DIAGNOSIS — R1032 Left lower quadrant pain: Secondary | ICD-10-CM | POA: Diagnosis not present

## 2014-12-13 DIAGNOSIS — R1110 Vomiting, unspecified: Secondary | ICD-10-CM | POA: Diagnosis not present

## 2014-12-13 DIAGNOSIS — F17210 Nicotine dependence, cigarettes, uncomplicated: Secondary | ICD-10-CM | POA: Diagnosis not present

## 2014-12-13 DIAGNOSIS — B182 Chronic viral hepatitis C: Secondary | ICD-10-CM | POA: Diagnosis not present

## 2014-12-13 DIAGNOSIS — R112 Nausea with vomiting, unspecified: Secondary | ICD-10-CM | POA: Diagnosis not present

## 2014-12-13 DIAGNOSIS — E785 Hyperlipidemia, unspecified: Secondary | ICD-10-CM | POA: Diagnosis not present

## 2015-01-09 DIAGNOSIS — E876 Hypokalemia: Secondary | ICD-10-CM | POA: Diagnosis not present

## 2015-01-09 DIAGNOSIS — R109 Unspecified abdominal pain: Secondary | ICD-10-CM | POA: Diagnosis not present

## 2015-01-09 DIAGNOSIS — K7689 Other specified diseases of liver: Secondary | ICD-10-CM | POA: Diagnosis not present

## 2015-01-09 DIAGNOSIS — R748 Abnormal levels of other serum enzymes: Secondary | ICD-10-CM | POA: Diagnosis not present

## 2015-01-09 DIAGNOSIS — R0602 Shortness of breath: Secondary | ICD-10-CM | POA: Diagnosis not present

## 2015-01-09 DIAGNOSIS — E860 Dehydration: Secondary | ICD-10-CM | POA: Diagnosis not present

## 2015-01-09 DIAGNOSIS — I700 Atherosclerosis of aorta: Secondary | ICD-10-CM | POA: Diagnosis not present

## 2015-01-10 DIAGNOSIS — R1032 Left lower quadrant pain: Secondary | ICD-10-CM | POA: Diagnosis not present

## 2015-01-10 DIAGNOSIS — R112 Nausea with vomiting, unspecified: Secondary | ICD-10-CM | POA: Diagnosis not present

## 2015-01-10 DIAGNOSIS — K5900 Constipation, unspecified: Secondary | ICD-10-CM | POA: Diagnosis not present

## 2015-01-10 DIAGNOSIS — R1012 Left upper quadrant pain: Secondary | ICD-10-CM | POA: Diagnosis not present

## 2015-01-11 DIAGNOSIS — R1032 Left lower quadrant pain: Secondary | ICD-10-CM | POA: Diagnosis not present

## 2015-01-11 DIAGNOSIS — R112 Nausea with vomiting, unspecified: Secondary | ICD-10-CM | POA: Diagnosis not present

## 2015-01-11 DIAGNOSIS — R1012 Left upper quadrant pain: Secondary | ICD-10-CM | POA: Diagnosis not present

## 2015-02-10 DIAGNOSIS — F332 Major depressive disorder, recurrent severe without psychotic features: Secondary | ICD-10-CM | POA: Diagnosis not present

## 2015-02-11 DIAGNOSIS — F332 Major depressive disorder, recurrent severe without psychotic features: Secondary | ICD-10-CM | POA: Diagnosis not present

## 2015-02-12 DIAGNOSIS — F329 Major depressive disorder, single episode, unspecified: Secondary | ICD-10-CM | POA: Diagnosis not present

## 2015-03-30 DIAGNOSIS — F332 Major depressive disorder, recurrent severe without psychotic features: Secondary | ICD-10-CM | POA: Diagnosis not present

## 2015-04-02 DIAGNOSIS — K219 Gastro-esophageal reflux disease without esophagitis: Secondary | ICD-10-CM | POA: Diagnosis not present

## 2015-04-02 DIAGNOSIS — Z8719 Personal history of other diseases of the digestive system: Secondary | ICD-10-CM | POA: Diagnosis not present

## 2015-04-02 DIAGNOSIS — R899 Unspecified abnormal finding in specimens from other organs, systems and tissues: Secondary | ICD-10-CM | POA: Diagnosis not present

## 2015-04-02 DIAGNOSIS — T3995XA Adverse effect of unspecified nonopioid analgesic, antipyretic and antirheumatic, initial encounter: Secondary | ICD-10-CM | POA: Diagnosis not present

## 2015-04-02 DIAGNOSIS — Z886 Allergy status to analgesic agent status: Secondary | ICD-10-CM | POA: Diagnosis not present

## 2015-04-02 DIAGNOSIS — B1920 Unspecified viral hepatitis C without hepatic coma: Secondary | ICD-10-CM | POA: Diagnosis not present

## 2015-04-02 DIAGNOSIS — Z79899 Other long term (current) drug therapy: Secondary | ICD-10-CM | POA: Diagnosis not present

## 2015-04-02 DIAGNOSIS — F17210 Nicotine dependence, cigarettes, uncomplicated: Secondary | ICD-10-CM | POA: Diagnosis not present

## 2015-04-02 DIAGNOSIS — R319 Hematuria, unspecified: Secondary | ICD-10-CM | POA: Diagnosis not present

## 2015-04-02 DIAGNOSIS — Z9049 Acquired absence of other specified parts of digestive tract: Secondary | ICD-10-CM | POA: Diagnosis not present

## 2015-04-02 DIAGNOSIS — K5903 Drug induced constipation: Secondary | ICD-10-CM | POA: Diagnosis not present

## 2015-04-02 DIAGNOSIS — N390 Urinary tract infection, site not specified: Secondary | ICD-10-CM | POA: Diagnosis not present

## 2015-04-02 DIAGNOSIS — R109 Unspecified abdominal pain: Secondary | ICD-10-CM | POA: Diagnosis not present

## 2015-04-02 DIAGNOSIS — Z888 Allergy status to other drugs, medicaments and biological substances status: Secondary | ICD-10-CM | POA: Diagnosis not present

## 2015-04-02 DIAGNOSIS — K5900 Constipation, unspecified: Secondary | ICD-10-CM | POA: Diagnosis not present

## 2015-04-02 DIAGNOSIS — Z9889 Other specified postprocedural states: Secondary | ICD-10-CM | POA: Diagnosis not present

## 2015-04-02 DIAGNOSIS — J9811 Atelectasis: Secondary | ICD-10-CM | POA: Diagnosis not present

## 2015-04-02 DIAGNOSIS — K432 Incisional hernia without obstruction or gangrene: Secondary | ICD-10-CM | POA: Diagnosis not present

## 2015-04-02 DIAGNOSIS — F419 Anxiety disorder, unspecified: Secondary | ICD-10-CM | POA: Diagnosis not present

## 2015-04-02 DIAGNOSIS — R112 Nausea with vomiting, unspecified: Secondary | ICD-10-CM | POA: Diagnosis not present

## 2015-04-02 DIAGNOSIS — R799 Abnormal finding of blood chemistry, unspecified: Secondary | ICD-10-CM | POA: Diagnosis not present

## 2015-04-02 DIAGNOSIS — F329 Major depressive disorder, single episode, unspecified: Secondary | ICD-10-CM | POA: Diagnosis not present

## 2015-04-02 DIAGNOSIS — Z90710 Acquired absence of both cervix and uterus: Secondary | ICD-10-CM | POA: Diagnosis not present

## 2015-04-14 DIAGNOSIS — Z79899 Other long term (current) drug therapy: Secondary | ICD-10-CM | POA: Diagnosis not present

## 2015-06-02 DIAGNOSIS — F320 Major depressive disorder, single episode, mild: Secondary | ICD-10-CM | POA: Diagnosis not present

## 2015-08-05 DIAGNOSIS — J310 Chronic rhinitis: Secondary | ICD-10-CM | POA: Diagnosis not present

## 2015-08-05 DIAGNOSIS — H66009 Acute suppurative otitis media without spontaneous rupture of ear drum, unspecified ear: Secondary | ICD-10-CM | POA: Diagnosis not present

## 2015-08-13 DIAGNOSIS — F339 Major depressive disorder, recurrent, unspecified: Secondary | ICD-10-CM | POA: Diagnosis not present

## 2015-08-14 DIAGNOSIS — F339 Major depressive disorder, recurrent, unspecified: Secondary | ICD-10-CM | POA: Diagnosis not present

## 2015-08-15 DIAGNOSIS — F339 Major depressive disorder, recurrent, unspecified: Secondary | ICD-10-CM | POA: Diagnosis not present

## 2015-08-17 DIAGNOSIS — R1013 Epigastric pain: Secondary | ICD-10-CM | POA: Diagnosis not present

## 2015-08-17 DIAGNOSIS — F17200 Nicotine dependence, unspecified, uncomplicated: Secondary | ICD-10-CM | POA: Diagnosis not present

## 2015-08-17 DIAGNOSIS — R55 Syncope and collapse: Secondary | ICD-10-CM | POA: Diagnosis not present

## 2015-08-17 DIAGNOSIS — R1084 Generalized abdominal pain: Secondary | ICD-10-CM | POA: Diagnosis not present

## 2015-09-11 IMAGING — CR DG LUMBAR SPINE 2-3V
1 series · 3 of 3 positions shown · non-contrast
Comparison: 02/21/2013 CT and 08/10/2012 abdominal radiographs

CLINICAL DATA: 47-year-old female with low back pain radiating down
left leg.

EXAM:
LUMBAR SPINE - 2-3 VIEW

[Series 1: t lumbar spine ap · 0.14mm/px · 3 of 3 slices shown]
[im 1/3]
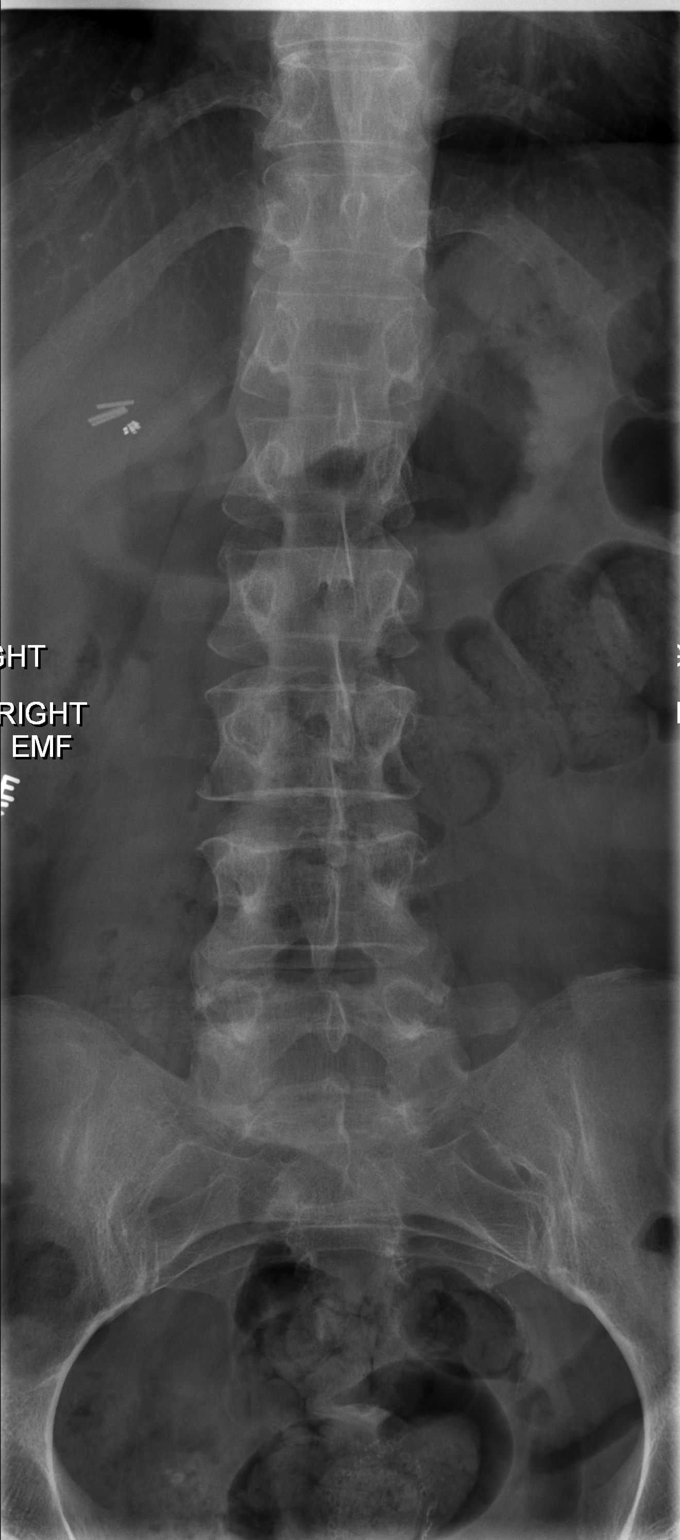
[im 2/3]
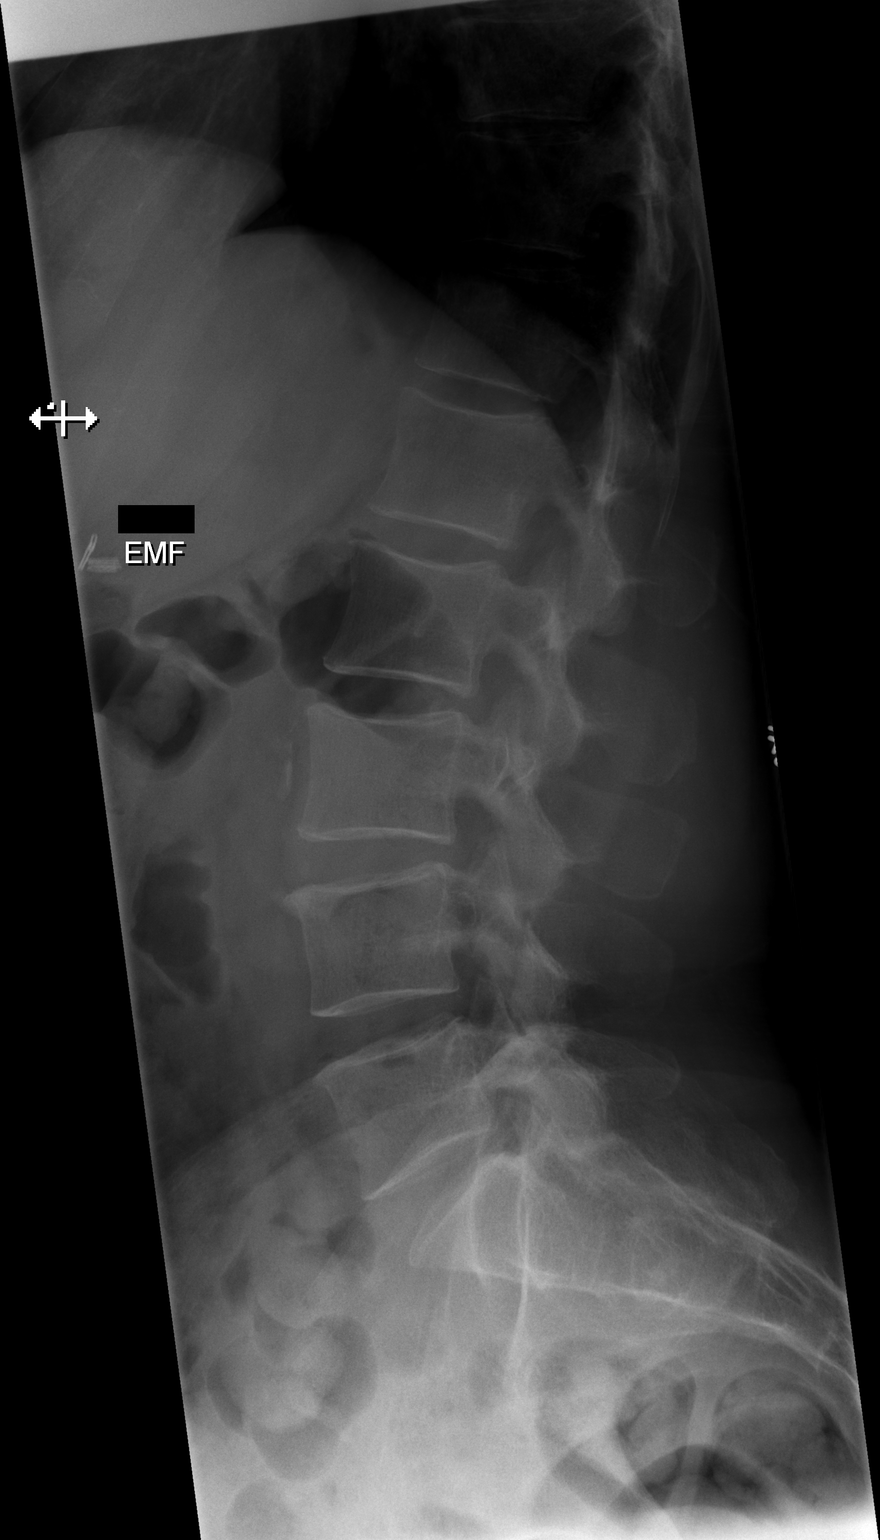
[im 3/3]
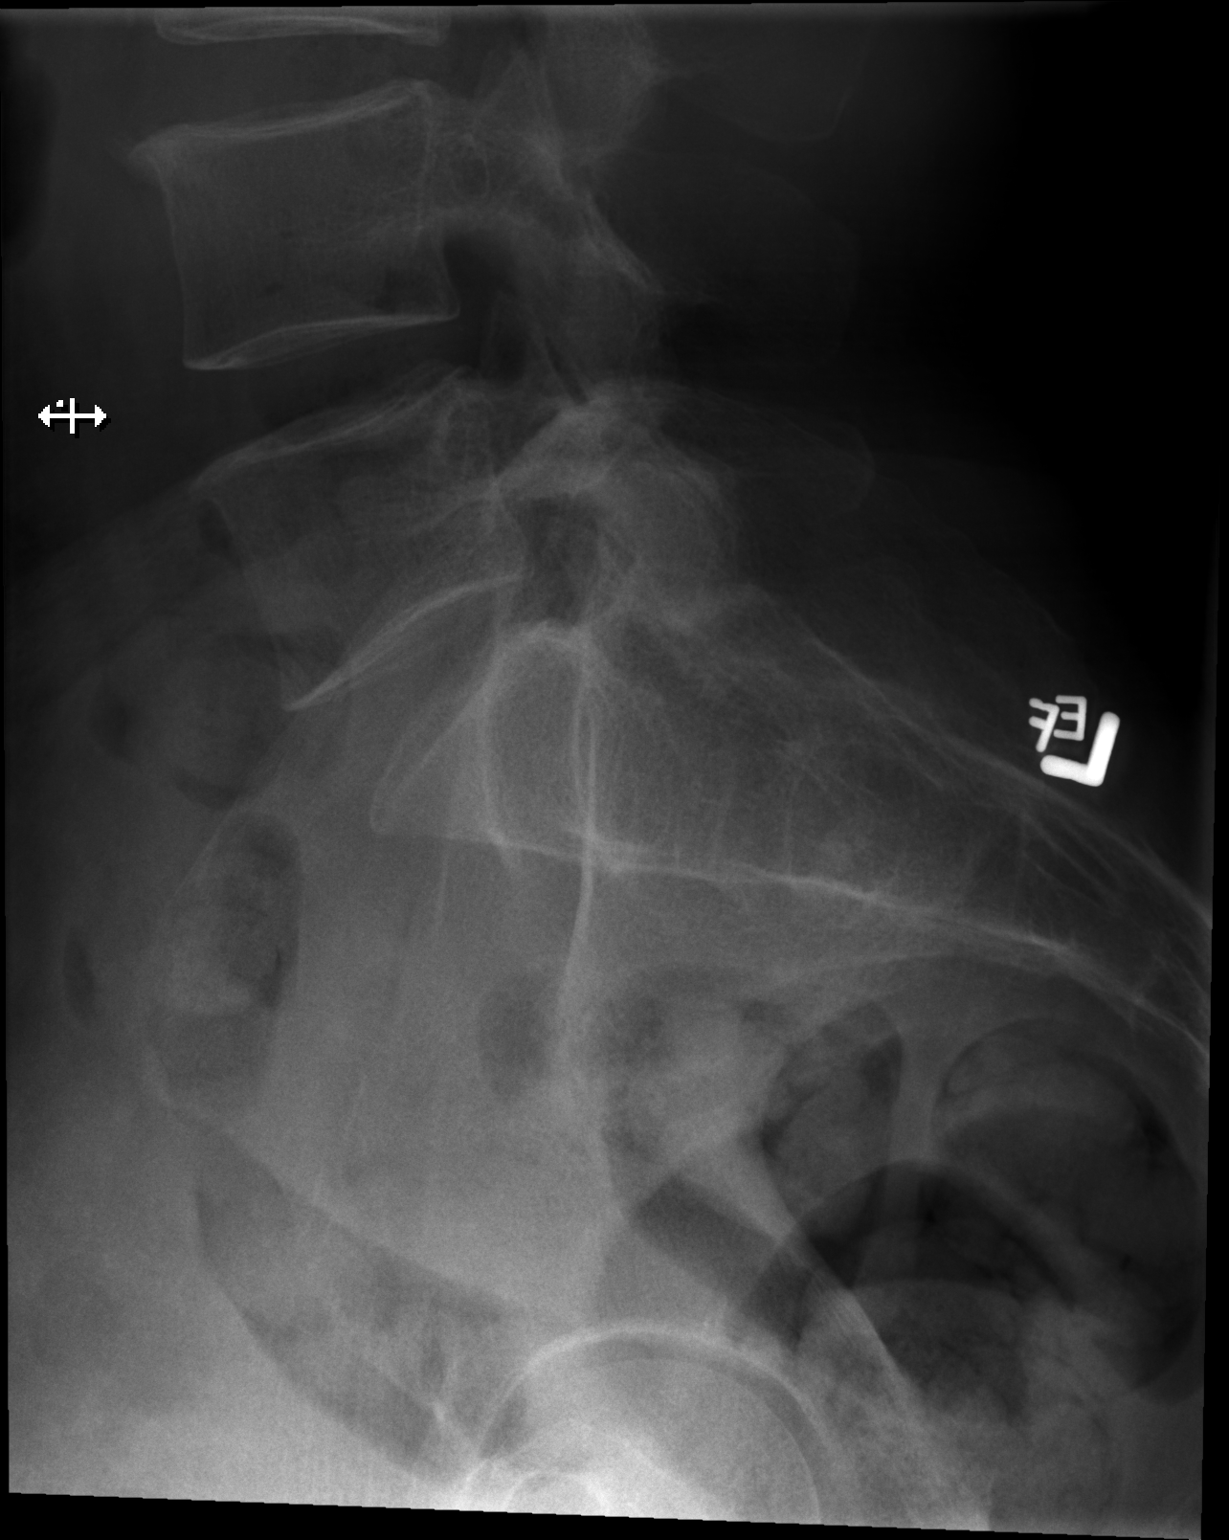

[3 of 3 positions shown; findings below may reference images not displayed]

FINDINGS: Five non rib-bearing lumbar type vertebra are again identified.

There is no evidence of fracture or subluxation.

The disc spaces are maintained.

Very mild multilevel degenerative disc disease noted.

No focal bony lesions are present.
IMPRESSION: No evidence of acute abnormality.

Very mild multilevel degenerative disc disease.

## 2015-10-09 IMAGING — CT CT ANGIO CHEST
2 of 6 series · 18 of 36 positions shown · IV contrast (APPLIED)
Comparison: Chest CTA 03/27/2013

CLINICAL DATA: Chest and back pain and syncope.

EXAM:
CT ANGIOGRAPHY CHEST WITH CONTRAST
TECHNIQUE: Multidetector CT imaging of the chest was performed using the
standard protocol during bolus administration of intravenous
contrast. Multiplanar CT image reconstructions including MIPs were
obtained to evaluate the vascular anatomy.
CONTRAST:  100 mL Isovue 370

[Series 6: pe 1.0 thins · axial · 0.68mm/px · z∈[-242,+4]mm · 17 of 278 slices shown]
[im 16/278  lung]
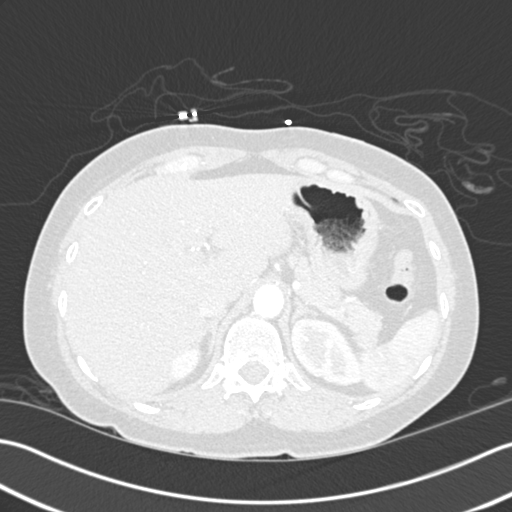
[im 31/278  mediastinal]
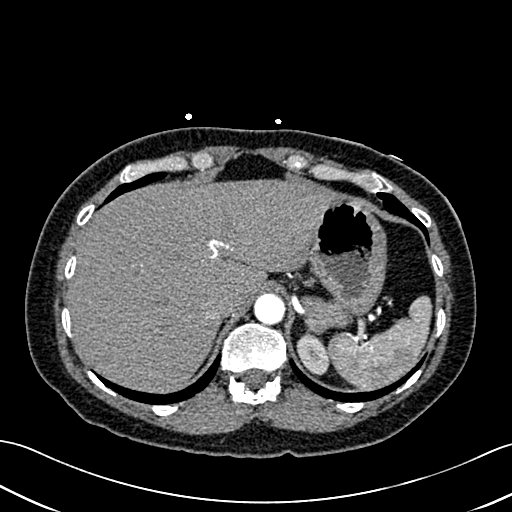
[im 47/278  lung]
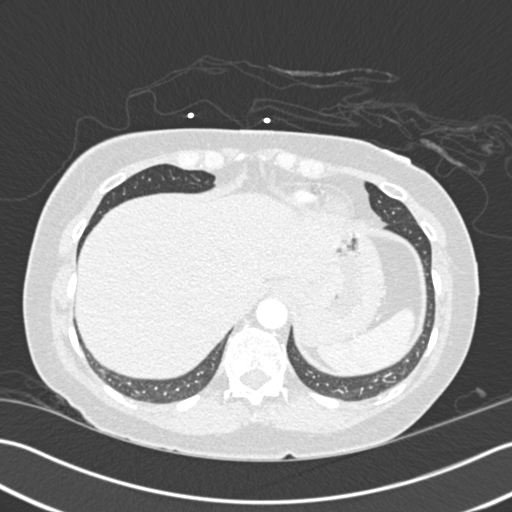
[im 62/278  mediastinal]
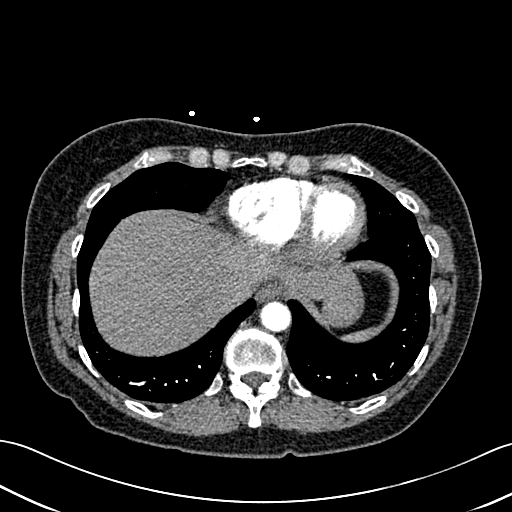
[im 77/278  lung]
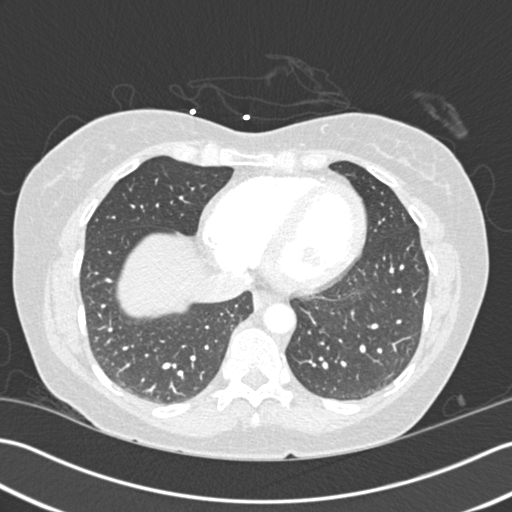
[im 93/278  mediastinal]
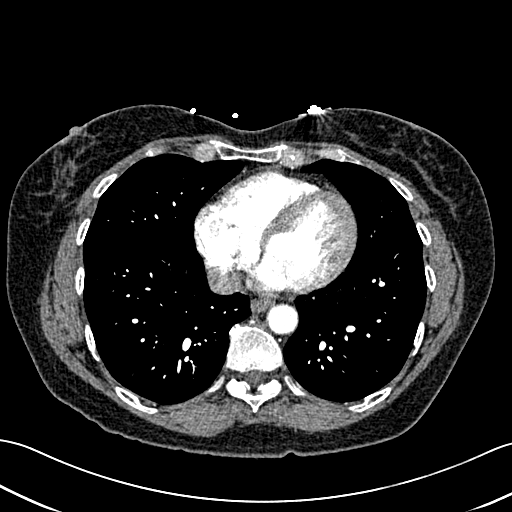
[im 108/278  lung]
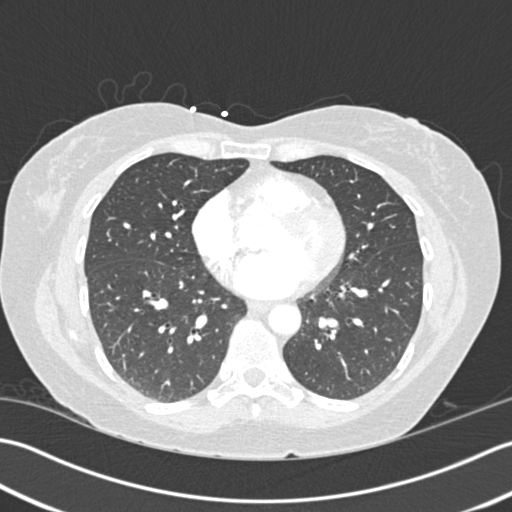
[im 124/278  mediastinal]
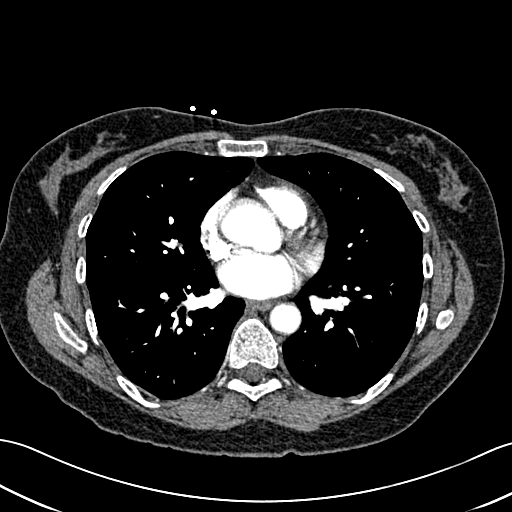
[im 139/278  lung]
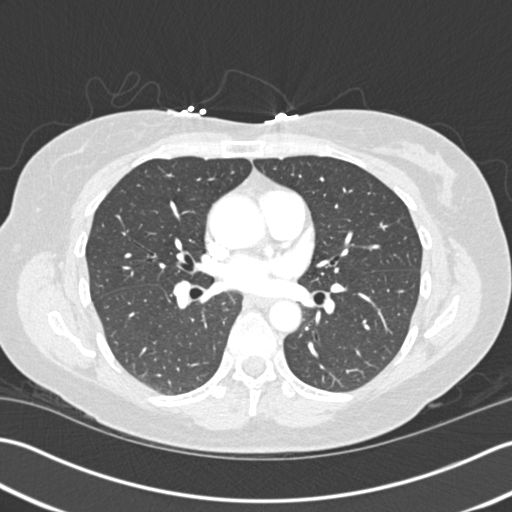
[im 154/278  mediastinal]
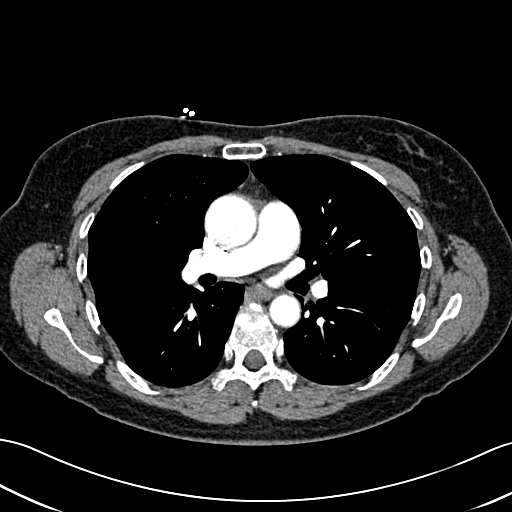
[im 170/278  lung]
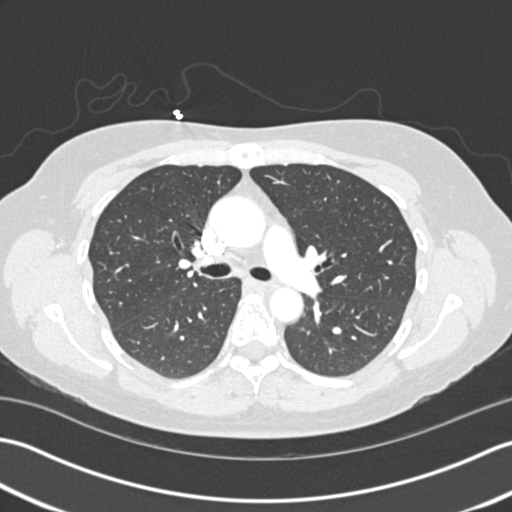
[im 185/278  mediastinal]
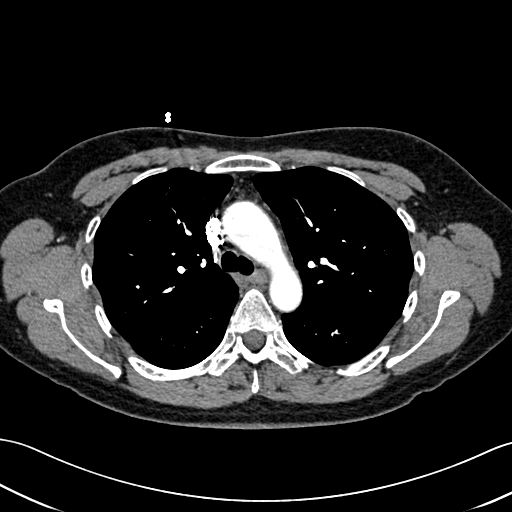
[im 201/278  lung]
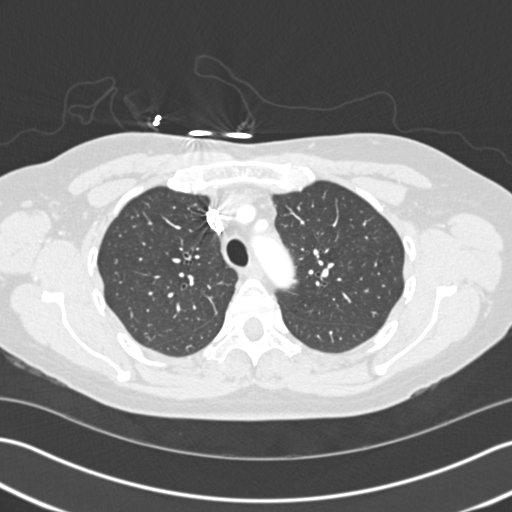
[im 216/278  mediastinal]
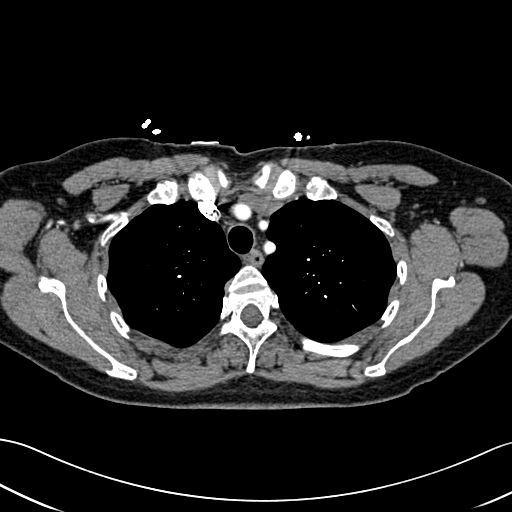
[im 231/278  lung]
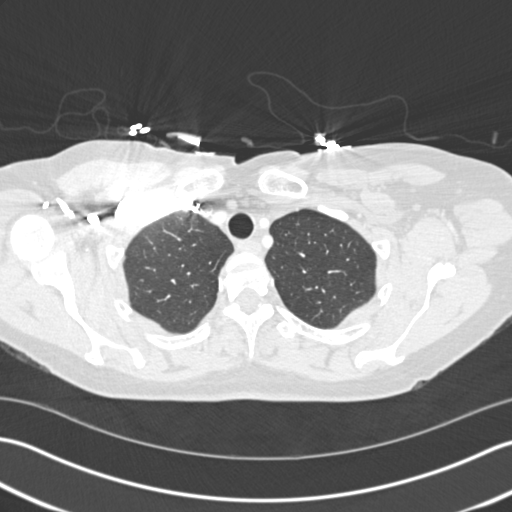
[im 247/278  mediastinal]
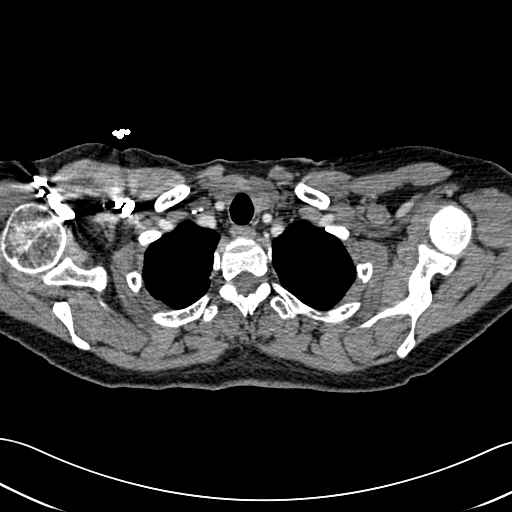
[im 262/278  lung]
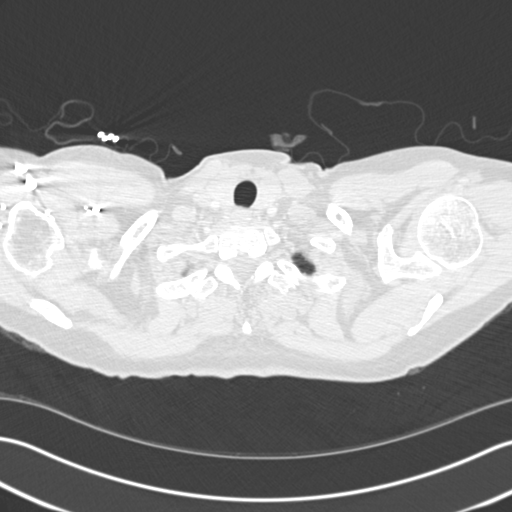

[Series 8: cor pe 2.0 mpr · coronal · 0.56mm/px · 1 of 114 slices shown]
[im 57/114  mediastinal]
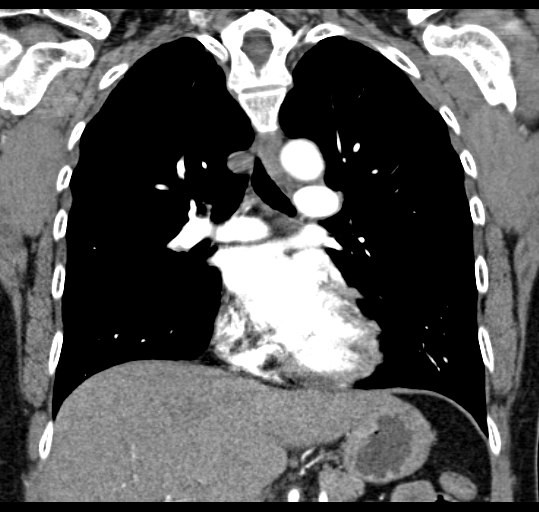

[18 of 36 positions shown; findings below may reference images not displayed]

FINDINGS: Pulmonary arterial tree opacification is adequate without evidence
of pulmonary embolus. The heart is unremarkable in appearance. No
enlarged axillary, mediastinal, or hilar lymph nodes are identified.

There is no pleural or pericardial effusion. The lungs are clear
without consolidation or nodules seen.

The visualized portion of the upper abdomen and osseous structures
are unremarkable.

Review of the MIP images confirms the above findings.
IMPRESSION: Unremarkable examination.  No evidence of pulmonary embolus.

## 2015-10-21 DIAGNOSIS — R05 Cough: Secondary | ICD-10-CM | POA: Diagnosis not present

## 2015-10-21 DIAGNOSIS — H6692 Otitis media, unspecified, left ear: Secondary | ICD-10-CM | POA: Diagnosis not present

## 2015-10-21 DIAGNOSIS — F17210 Nicotine dependence, cigarettes, uncomplicated: Secondary | ICD-10-CM | POA: Diagnosis not present

## 2015-11-04 DIAGNOSIS — Z79899 Other long term (current) drug therapy: Secondary | ICD-10-CM | POA: Diagnosis not present

## 2015-11-04 DIAGNOSIS — Z980 Intestinal bypass and anastomosis status: Secondary | ICD-10-CM | POA: Diagnosis not present

## 2015-11-04 DIAGNOSIS — R140 Abdominal distension (gaseous): Secondary | ICD-10-CM | POA: Diagnosis not present

## 2015-11-04 DIAGNOSIS — F17210 Nicotine dependence, cigarettes, uncomplicated: Secondary | ICD-10-CM | POA: Diagnosis not present

## 2015-11-04 DIAGNOSIS — R1032 Left lower quadrant pain: Secondary | ICD-10-CM | POA: Diagnosis not present

## 2015-11-04 DIAGNOSIS — R1084 Generalized abdominal pain: Secondary | ICD-10-CM | POA: Diagnosis not present

## 2015-11-04 DIAGNOSIS — R1011 Right upper quadrant pain: Secondary | ICD-10-CM | POA: Diagnosis not present

## 2016-02-15 DIAGNOSIS — A084 Viral intestinal infection, unspecified: Secondary | ICD-10-CM | POA: Diagnosis not present

## 2016-02-15 DIAGNOSIS — R319 Hematuria, unspecified: Secondary | ICD-10-CM | POA: Diagnosis not present

## 2016-03-28 DIAGNOSIS — L739 Follicular disorder, unspecified: Secondary | ICD-10-CM | POA: Diagnosis not present

## 2016-03-30 DIAGNOSIS — Z933 Colostomy status: Secondary | ICD-10-CM | POA: Diagnosis not present

## 2016-03-30 DIAGNOSIS — N611 Abscess of the breast and nipple: Secondary | ICD-10-CM | POA: Diagnosis not present

## 2016-05-16 DIAGNOSIS — Z7251 High risk heterosexual behavior: Secondary | ICD-10-CM | POA: Diagnosis not present

## 2016-05-16 DIAGNOSIS — Z113 Encounter for screening for infections with a predominantly sexual mode of transmission: Secondary | ICD-10-CM | POA: Diagnosis not present

## 2016-05-16 DIAGNOSIS — Z7721 Contact with and (suspected) exposure to potentially hazardous body fluids: Secondary | ICD-10-CM | POA: Diagnosis not present

## 2016-05-16 DIAGNOSIS — Z7189 Other specified counseling: Secondary | ICD-10-CM | POA: Diagnosis not present

## 2016-05-26 DIAGNOSIS — Z113 Encounter for screening for infections with a predominantly sexual mode of transmission: Secondary | ICD-10-CM | POA: Diagnosis not present

## 2016-07-06 DIAGNOSIS — Z9049 Acquired absence of other specified parts of digestive tract: Secondary | ICD-10-CM | POA: Diagnosis not present

## 2016-07-06 DIAGNOSIS — Z8719 Personal history of other diseases of the digestive system: Secondary | ICD-10-CM | POA: Diagnosis not present

## 2016-07-06 DIAGNOSIS — K529 Noninfective gastroenteritis and colitis, unspecified: Secondary | ICD-10-CM | POA: Diagnosis not present

## 2016-07-06 DIAGNOSIS — I444 Left anterior fascicular block: Secondary | ICD-10-CM | POA: Diagnosis not present

## 2016-07-06 DIAGNOSIS — R1084 Generalized abdominal pain: Secondary | ICD-10-CM | POA: Diagnosis not present

## 2016-12-04 ENCOUNTER — Encounter: Payer: Self-pay | Admitting: Emergency Medicine

## 2016-12-04 ENCOUNTER — Emergency Department
Admission: EM | Admit: 2016-12-04 | Discharge: 2016-12-04 | Disposition: A | Discharge status: Home / Self Care | Payer: Medicare Other | Attending: Emergency Medicine | Admitting: Emergency Medicine

## 2016-12-04 DIAGNOSIS — M5432 Sciatica, left side: Secondary | ICD-10-CM | POA: Insufficient documentation

## 2016-12-04 DIAGNOSIS — F17210 Nicotine dependence, cigarettes, uncomplicated: Secondary | ICD-10-CM | POA: Diagnosis not present

## 2016-12-04 DIAGNOSIS — Z79899 Other long term (current) drug therapy: Secondary | ICD-10-CM | POA: Diagnosis not present

## 2016-12-04 DIAGNOSIS — M545 Low back pain: Secondary | ICD-10-CM | POA: Diagnosis present

## 2016-12-04 DIAGNOSIS — M5441 Lumbago with sciatica, right side: Secondary | ICD-10-CM | POA: Diagnosis not present

## 2016-12-04 MED ORDER — OXYCODONE-ACETAMINOPHEN 5-325 MG PO TABS
1.0000 | ORAL_TABLET | Freq: Once | ORAL | Status: AC
  Administered 2016-12-04: 1 via ORAL
  Filled 2016-12-04: qty 1

## 2016-12-04 MED ORDER — METHYLPREDNISOLONE SODIUM SUCC 125 MG IJ SOLR
125.0000 mg | Freq: Once | INTRAMUSCULAR | Status: AC
  Administered 2016-12-04: 125 mg via INTRAMUSCULAR
  Filled 2016-12-04: qty 2

## 2016-12-04 MED ORDER — PREDNISONE 10 MG PO TABS: ORAL_TABLET | ORAL | 0 refills | Status: AC

## 2016-12-04 MED ORDER — IBUPROFEN 600 MG PO TABS: 600.0000 mg | ORAL_TABLET | Freq: Four times a day (QID) | ORAL | 0 refills | Status: AC | PRN

## 2016-12-04 NOTE — ED Notes (Signed)
Pt states lower back pain going down R buttock, R hip and R leg. Pt appears uncomfortable. Moving all extremities by self. Pain x week. Has taken advil, ibuprofen, tylenol, and aleeve.

## 2016-12-04 NOTE — ED Provider Notes (Signed)
[facility] Emergency Department Provider Note  ____________________________________________  Time seen: Approximately 2:17 PM  I have reviewed the triage vital signs and the nursing notes.   HISTORY  Chief Complaint Sciatica    HPI Julia Vega is a 51 y.o. female that presents to the emergency department with right lower back pain that radiates down the back of right leg for 2 weeks. Patient describes the pain as a shooting pain. She has a history of sciatica and this feels the same. She had an injection of an unknown medicine 2 years ago and has not needed an injection since. Her sciatica flares up every now and then but has not been this bad. She has a colostomy. She denies bladder dysfunction or saddle paresthesias.She has been taking ibuprofen and Tylenol for pain. She denies fever, shortness of breath, chest pain, nausea, vomiting, abdominal pain, dysuria, urgency, frequency, numbness, tingling.   Past Medical History:  Diagnosis Date  . Crohn's colitis ([place])   . RSD (reflex sympathetic dystrophy)     There are no active problems to display for this patient.   Past Surgical History:  Procedure Laterality Date  . ABDOMINAL HYSTERECTOMY    . COLON SURGERY      Prior to Admission medications   Medication Sig Start Date End Date Taking? Authorizing Provider  gabapentin (NEURONTIN) 300 MG capsule Take 300 mg by mouth 3 (three) times daily.    [provider]  ibuprofen (ADVIL,MOTRIN) 600 MG tablet Take 1 tablet (600 mg total) by mouth every 6 (six) hours as needed. 12/04/16   Laban Emperor, PA-C  predniSONE (DELTASONE) 10 MG tablet Take 6 tablets on day 1, take 5 tablets on day 2, take 4 tablets on day 3, take 3 tablets on day 4, take 2 tablets on day 5, take 1 tablet on day 6 12/04/16   Laban Emperor, PA-C  QUEtiapine (SEROQUEL) 200 MG tablet Take 200 mg by mouth at bedtime.    [provider]    Allergies Patient has no  known allergies.  No family history on file.  Social History Social History  Substance Use Topics  . Smoking status: Current Every Day Smoker    Packs/day: 0.50    Types: Cigarettes  . Smokeless tobacco: Never Used  . Alcohol use No     Review of Systems  Constitutional: No fever/chills Cardiovascular: No chest pain. Respiratory: No SOB. Gastrointestinal: No abdominal pain.  No nausea, no vomiting.  Musculoskeletal: Positive for back pain. Skin: Negative for rash, abrasions, lacerations, ecchymosis. Neurological: Negative for headaches or tingling   ____________________________________________   PHYSICAL EXAM:  VITAL SIGNS: ED Triage Vitals  Enc Vitals Group     BP 12/04/16 1404 (!) 156/86     Pulse Rate 12/04/16 1404 70     Resp 12/04/16 1404 20     Temp 12/04/16 1404 98 F (36.7 C)     Temp Source 12/04/16 1404 Oral     SpO2 12/04/16 1404 99 %     Weight 12/04/16 1406 165 lb (74.8 kg)     Height 12/04/16 1406 5\' 6"  (1.676 m)     Head Circumference --      Peak Flow --      Pain Score 12/04/16 1404 8     Pain Loc --      Pain Edu? --      Excl. in [place]? --      Constitutional: Alert and oriented. Well appearing and in no  acute distress. Eyes: Conjunctivae are normal. PERRL. EOMI. Head: Atraumatic. ENT:      Ears:      Nose: No congestion/rhinnorhea.      Mouth/Throat: Mucous membranes are moist.  Neck: No stridor.   Cardiovascular: Normal rate, regular rhythm.  Good peripheral circulation. Respiratory: Normal respiratory effort without tachypnea or retractions. Lungs CTAB. Good air entry to the bases with no decreased or absent breath sounds. Gastrointestinal: Bowel sounds 4 quadrants. Soft and nontender to palpation.  Musculoskeletal: Full range of motion to all extremities. No gross deformities appreciated. Tenderness to palpation over right SI joint. Positive straight leg raise. Neurologic:  Normal speech and language. No gross focal neurologic  deficits are appreciated. Sensation of lower extremities intact. Skin:  Skin is warm, dry and intact. No rash noted. Psychiatric: Mood and affect are normal. Speech and behavior are normal. Patient exhibits appropriate insight and judgement.   ____________________________________________   LABS (all labs ordered are listed, but only abnormal results are displayed)  Labs Reviewed - No data to display ____________________________________________  EKG   ____________________________________________  RADIOLOGY   No results found.  ____________________________________________    PROCEDURES  Procedure(s) performed:    Procedures    Medications  methylPREDNISolone sodium succinate (SOLU-MEDROL) 125 mg/2 mL injection 125 mg (125 mg Intramuscular Given 12/04/16 1430)  oxyCODONE-acetaminophen (PERCOCET/ROXICET) 5-325 MG per tablet 1 tablet (1 tablet Oral Given 12/04/16 1429)     ____________________________________________   INITIAL IMPRESSION / ASSESSMENT AND PLAN / ED COURSE  Pertinent labs & imaging results that were available during my care of the patient were reviewed by me and considered in my medical decision making (see chart for details).  Review of the [place] CSRS was performed in accordance of the [place] prior to dispensing any controlled drugs.   Patient presented to emergency department for evaluation of low back pain. Diagnosis is consistent with sciatica. Vital signs and exam are reassuring. No bowel or bladder dysfunction saddle paresthesias. She is up walking around. Patient took 800 mg of ibuprofen 4 hours ago so I'll give her a Percocet here. She was also given Solu-Medrol. Patient will be discharged home with prescriptions for prednisone and ibuprofen. Patient is to follow up with PCP as directed. Patient is given ED precautions to return to the ED for any worsening or new symptoms.     ____________________________________________  FINAL CLINICAL  IMPRESSION(S) / ED DIAGNOSES  Final diagnoses:  Sciatica of left side      NEW MEDICATIONS STARTED DURING THIS VISIT:  Discharge Medication List as of 12/04/2016  3:04 PM    START taking these medications   Details  ibuprofen (ADVIL,MOTRIN) 600 MG tablet Take 1 tablet (600 mg total) by mouth every 6 (six) hours as needed., Starting Mon 12/04/2016, Print    predniSONE (DELTASONE) 10 MG tablet Take 6 tablets on day 1, take 5 tablets on day 2, take 4 tablets on day 3, take 3 tablets on day 4, take 2 tablets on day 5, take 1 tablet on day 6, Print            This chart was dictated using voice recognition software/Dragon. Despite best efforts to proofread, errors can occur which can change the meaning. Any change was purely unintentional.    Laban Emperor, PA-C 12/04/16 1535    Nena Polio, MD 12/06/16 2337

## 2016-12-04 NOTE — ED Triage Notes (Addendum)
Pt in via POV with complaints of worsening sciatic pain x approximately 2 weeks, pt reports pain beginning in right lower back, moving into right hip and down into leg.  Pt denies any recent injury.  Pt ambulatory to triage with some difficulty; pt appears uncomfortable.  NAD noted at this time.

## 2017-03-09 DIAGNOSIS — R05 Cough: Secondary | ICD-10-CM | POA: Diagnosis not present

## 2017-03-09 DIAGNOSIS — R110 Nausea: Secondary | ICD-10-CM | POA: Diagnosis not present

## 2017-03-09 DIAGNOSIS — R509 Fever, unspecified: Secondary | ICD-10-CM | POA: Diagnosis not present

## 2017-03-09 DIAGNOSIS — R062 Wheezing: Secondary | ICD-10-CM | POA: Diagnosis not present

## 2017-03-09 DIAGNOSIS — Z90710 Acquired absence of both cervix and uterus: Secondary | ICD-10-CM | POA: Diagnosis not present

## 2017-03-09 DIAGNOSIS — M791 Myalgia, unspecified site: Secondary | ICD-10-CM | POA: Diagnosis not present

## 2017-09-05 DIAGNOSIS — R61 Generalized hyperhidrosis: Secondary | ICD-10-CM | POA: Diagnosis not present

## 2017-09-05 DIAGNOSIS — R9431 Abnormal electrocardiogram [ECG] [EKG]: Secondary | ICD-10-CM | POA: Diagnosis not present

## 2017-09-05 DIAGNOSIS — R112 Nausea with vomiting, unspecified: Secondary | ICD-10-CM | POA: Diagnosis not present

## 2017-09-05 DIAGNOSIS — Z5321 Procedure and treatment not carried out due to patient leaving prior to being seen by health care provider: Secondary | ICD-10-CM | POA: Diagnosis not present

## 2017-09-05 DIAGNOSIS — R079 Chest pain, unspecified: Secondary | ICD-10-CM | POA: Diagnosis not present

## 2017-09-05 DIAGNOSIS — R531 Weakness: Secondary | ICD-10-CM | POA: Diagnosis not present

## 2017-09-06 DIAGNOSIS — R9431 Abnormal electrocardiogram [ECG] [EKG]: Secondary | ICD-10-CM | POA: Diagnosis not present

## 2017-11-29 DIAGNOSIS — K760 Fatty (change of) liver, not elsewhere classified: Secondary | ICD-10-CM | POA: Diagnosis not present
